# Patient Record
Sex: Female | Born: 1935 | Race: White | Hispanic: No | Marital: Married | Smoking: Current every day smoker
Health system: Southern US, Community
[De-identification: ages and names within clinical notes are randomized; demographics above are authoritative.]

## PROBLEM LIST (undated history)

## (undated) DIAGNOSIS — K219 Gastro-esophageal reflux disease without esophagitis: Secondary | ICD-10-CM

## (undated) DIAGNOSIS — C3490 Malignant neoplasm of unspecified part of unspecified bronchus or lung: Secondary | ICD-10-CM

## (undated) DIAGNOSIS — M810 Age-related osteoporosis without current pathological fracture: Secondary | ICD-10-CM

## (undated) HISTORY — PX: ESOPHAGEAL DILATION: SHX303

## (undated) HISTORY — PX: BACK SURGERY: SHX140

## (undated) HISTORY — PX: BREAST EXCISIONAL BIOPSY: SUR124

## (undated) HISTORY — PX: CERVICAL FUSION: SHX112

---

## 2004-12-08 ENCOUNTER — Emergency Department: Payer: Self-pay | Admitting: Emergency Medicine

## 2009-01-24 ENCOUNTER — Emergency Department: Payer: Self-pay | Admitting: Emergency Medicine

## 2014-06-30 ENCOUNTER — Emergency Department: Payer: Medicare Other

## 2014-06-30 ENCOUNTER — Encounter: Payer: Self-pay | Admitting: Emergency Medicine

## 2014-06-30 ENCOUNTER — Emergency Department
Admission: EM | Admit: 2014-06-30 | Discharge: 2014-07-01 | Disposition: A | Discharge status: Home / Self Care | Payer: Medicare Other | Attending: Emergency Medicine | Admitting: Emergency Medicine

## 2014-06-30 DIAGNOSIS — M546 Pain in thoracic spine: Secondary | ICD-10-CM | POA: Diagnosis not present

## 2014-06-30 DIAGNOSIS — J159 Unspecified bacterial pneumonia: Secondary | ICD-10-CM | POA: Insufficient documentation

## 2014-06-30 DIAGNOSIS — Z9889 Other specified postprocedural states: Secondary | ICD-10-CM | POA: Insufficient documentation

## 2014-06-30 DIAGNOSIS — R091 Pleurisy: Secondary | ICD-10-CM

## 2014-06-30 DIAGNOSIS — R05 Cough: Secondary | ICD-10-CM | POA: Diagnosis present

## 2014-06-30 DIAGNOSIS — J189 Pneumonia, unspecified organism: Secondary | ICD-10-CM

## 2014-06-30 DIAGNOSIS — Z720 Tobacco use: Secondary | ICD-10-CM | POA: Insufficient documentation

## 2014-06-30 NOTE — ED Provider Notes (Signed)
[facility] Emergency Department Provider Note  ____________________________________________  Time seen: 11:00 PM  I have reviewed the triage vital signs and the nursing notes.   HISTORY  Chief Complaint Back Pain and Cough      HPI Krystal Greene is a 79 y.o. female presents with mid upper back pain that has been progressively worsening since Monday pain is located in between the patient's scapula current pain is "mild" per patient however patient took 2 Excedrin prior to arrival to the emergency department. Patient admits to recent upper respiratory tract infection involving cough. Patient denies any chest pain at this time.     History reviewed. No pertinent past medical history.  There are no active problems to display for this patient.   Past Surgical History  Procedure Laterality Date  . Back surgery    . Cervical fusion    . Esophageal dilation      No current outpatient prescriptions on file.  Allergies Hydrocodone  No family history on file.  Social History History  Substance Use Topics  . Smoking status: Current Every Day Smoker -- 1.00 packs/day    Types: Cigarettes  . Smokeless tobacco: Never Used  . Alcohol Use: 0.6 oz/week    1 Glasses of wine per week    Review of Systems  Constitutional: Negative for fever. Eyes: Negative for visual changes. ENT: Negative for sore throat. Cardiovascular: Negative for chest pain. Respiratory: Negative for shortness of breath. Gastrointestinal: Negative for abdominal pain, vomiting and diarrhea. Genitourinary: Negative for dysuria. Musculoskeletal: Negative for back pain. Skin: Negative for rash. Neurological: Negative for headaches, focal weakness or numbness.   10-point ROS otherwise negative.  ____________________________________________   PHYSICAL EXAM:  VITAL SIGNS: ED Triage Vitals  Enc Vitals Group     BP 06/30/14 2214 171/87 mmHg     Pulse Rate 06/30/14 2214  111     Resp 06/30/14 2214 20     Temp 06/30/14 2214 98.4 F (36.9 C)     Temp Source 06/30/14 2214 Oral     SpO2 06/30/14 2214 94 %     Weight 06/30/14 2214 130 lb (58.968 kg)     Height 06/30/14 2214 '5\' 1"'$  (1.549 m)     Head Cir --      Peak Flow --      Pain Score 06/30/14 2216 5     Pain Loc --      Pain Edu? --      Excl. in [place]? --      Constitutional: Alert and oriented. Well appearing and in no distress. Eyes: Conjunctivae are normal. PERRL. Normal extraocular movements. ENT   Head: Normocephalic and atraumatic.   Nose: No congestion/rhinnorhea.   Mouth/Throat: Mucous membranes are moist.   Neck: No stridor. Hematological/Lymphatic/Immunilogical: No cervical lymphadenopathy. Cardiovascular: Normal rate, regular rhythm. Normal and symmetric distal pulses are present in all extremities. No murmurs, rubs, or gallops. Respiratory: Normal respiratory effort without tachypnea nor retractions. Breath sounds are clear and equal bilaterally. No wheezes/rales/rhonchi. Gastrointestinal: Soft and nontender. No distention. There is no CVA tenderness. Genitourinary: deferred Musculoskeletal: Nontender with normal range of motion in all extremities. No joint effusions.  No lower extremity tenderness nor edema. Neurologic:  Normal speech and language. No gross focal neurologic deficits are appreciated. Speech is normal.  Skin:  Skin is warm, dry and intact. No rash noted. Psychiatric: Mood and affect are normal. Speech and behavior are normal. Patient exhibits appropriate insight and judgment.  ____________________________________________  LABS (pertinent positives/negatives)  Labs Reviewed  CBC WITH DIFFERENTIAL/PLATELET - Abnormal; Notable for the following:    WBC 13.0 (*)    Neutro Abs 9.8 (*)    All other components within normal limits  COMPREHENSIVE METABOLIC PANEL - Abnormal; Notable for the following:    Chloride 99 (*)    Glucose, Bld 161 (*)    All other  components within normal limits  TROPONIN I     ____________________________________________    RADIOLOGY  1.5cm left upper lobe ground glass opacity  ____________________________________________     INITIAL IMPRESSION / ASSESSMENT AND PLAN / ED COURSE  Pertinent labs & imaging results that were available during my care of the patient were reviewed by me and considered in my medical decision making (see chart for details).  History, physical and CT findings consistent with possible pleurisy with possible pneumonia. Patient and family advised of CT findings and need fo followup  ____________________________________________   FINAL CLINICAL IMPRESSION(S) / ED DIAGNOSES  Final diagnoses:  Pleurisy  Community acquired pneumonia      Gregor Hams, MD 07/02/14 2234

## 2014-06-30 NOTE — ED Notes (Signed)
Pt presents to ED with back pain since Monday. Pt states her pain started on both sides of her mid back and has now radiated inbetween her scapula. Pain has increased in severity. No known injury and no hx of the same. Pt alert with no increased work of breathing or acute distress noted. Pt denies chest pain, nausea, vomiting, or diarrhea.

## 2014-07-01 ENCOUNTER — Emergency Department: Payer: Medicare Other

## 2014-07-01 DIAGNOSIS — J159 Unspecified bacterial pneumonia: Secondary | ICD-10-CM | POA: Diagnosis not present

## 2014-07-01 LAB — COMPREHENSIVE METABOLIC PANEL
ALBUMIN: 4 g/dL (ref 3.5–5.0)
ALT: 16 U/L (ref 14–54)
AST: 21 U/L (ref 15–41)
Alkaline Phosphatase: 78 U/L (ref 38–126)
Anion gap: 10 (ref 5–15)
BILIRUBIN TOTAL: 0.5 mg/dL (ref 0.3–1.2)
BUN: 15 mg/dL (ref 6–20)
CALCIUM: 9.6 mg/dL (ref 8.9–10.3)
CO2: 27 mmol/L (ref 22–32)
CREATININE: 0.76 mg/dL (ref 0.44–1.00)
Chloride: 99 mmol/L — ABNORMAL LOW (ref 101–111)
GFR calc Af Amer: >60 mL/min (ref >60)
Glucose, Bld: 161 mg/dL — ABNORMAL HIGH (ref 65–99)
Potassium: 3.7 mmol/L (ref 3.5–5.1)
SODIUM: 136 mmol/L (ref 135–145)
Total Protein: 7.7 g/dL (ref 6.5–8.1)

## 2014-07-01 LAB — CBC WITH DIFFERENTIAL/PLATELET
BASOS PCT: 1 %
Basophils Absolute: 0.1 10*3/uL (ref 0–0.1)
Eosinophils Absolute: 0.2 10*3/uL (ref 0–0.7)
Eosinophils Relative: 2 %
HCT: 43.8 % (ref 35.0–47.0)
HEMOGLOBIN: 14.9 g/dL (ref 12.0–16.0)
Lymphocytes Relative: 16 %
Lymphs Abs: 2 10*3/uL (ref 1.0–3.6)
MCH: 30.7 pg (ref 26.0–34.0)
MCHC: 34 g/dL (ref 32.0–36.0)
MCV: 90.3 fL (ref 80.0–100.0)
MONOS PCT: 6 %
Monocytes Absolute: 0.7 10*3/uL (ref 0.2–0.9)
NEUTROS ABS: 9.8 10*3/uL — AB (ref 1.4–6.5)
Neutrophils Relative %: 75 %
Platelets: 415 10*3/uL (ref 150–440)
RBC: 4.85 MIL/uL (ref 3.80–5.20)
RDW: 13.3 % (ref 11.5–14.5)
WBC: 13 10*3/uL — AB (ref 3.6–11.0)

## 2014-07-01 LAB — TROPONIN I: Troponin I: <0.03 ng/mL (ref <0.031)

## 2014-07-01 MED ORDER — AZITHROMYCIN 500 MG PO TABS: 500.0000 mg | ORAL_TABLET | Freq: Every day | ORAL | Status: DC

## 2014-07-01 MED ORDER — IOHEXOL 350 MG/ML SOLN
75.0000 mL | Freq: Once | INTRAVENOUS | Status: AC | PRN
  Administered 2014-07-01: 75 mL via INTRAVENOUS

## 2014-07-01 MED ORDER — KETOROLAC TROMETHAMINE 10 MG PO TABS: 10.0000 mg | ORAL_TABLET | Freq: Four times a day (QID) | ORAL | Status: DC | PRN

## 2014-07-01 MED ORDER — KETOROLAC TROMETHAMINE 10 MG PO TABS
ORAL_TABLET | ORAL | Status: AC
  Administered 2014-07-01: 10 mg via ORAL
  Filled 2014-07-01: qty 1

## 2014-07-01 MED ORDER — OXYCODONE-ACETAMINOPHEN 5-325 MG PO TABS
1.0000 | ORAL_TABLET | Freq: Once | ORAL | Status: AC
  Administered 2014-07-01: 1 via ORAL

## 2014-07-01 MED ORDER — KETOROLAC TROMETHAMINE 10 MG PO TABS
10.0000 mg | ORAL_TABLET | Freq: Once | ORAL | Status: AC
  Administered 2014-07-01: 10 mg via ORAL

## 2014-07-01 MED ORDER — AZITHROMYCIN 250 MG PO TABS
ORAL_TABLET | ORAL | Status: AC
  Administered 2014-07-01: 500 mg via ORAL
  Filled 2014-07-01: qty 2

## 2014-07-01 MED ORDER — AZITHROMYCIN 250 MG PO TABS
500.0000 mg | ORAL_TABLET | Freq: Once | ORAL | Status: AC
  Administered 2014-07-01: 500 mg via ORAL

## 2014-07-01 NOTE — Discharge Instructions (Signed)
Pleurisy Pleurisy is an inflammation and swelling of the lining of the lungs (pleura). Because of this inflammation, it hurts to breathe. It can be aggravated by coughing, laughing, or deep breathing. Pleurisy is often caused by an underlying infection or disease.  HOME CARE INSTRUCTIONS  Monitor your pleurisy for any changes. The following actions may help to alleviate any discomfort you are experiencing:  Medicine may help with pain. Only take over-the-counter or prescription medicines for pain, discomfort, or fever as directed by your health care provider.  Only take antibiotic medicine as directed. Make sure to finish it even if you start to feel better. SEEK MEDICAL CARE IF:   Your pain is not controlled with medicine or is increasing.  You have an increase in pus-like (purulent) secretions brought up with coughing. SEEK IMMEDIATE MEDICAL CARE IF:   You have blue or dark lips, fingernails, or toenails.  You are coughing up blood.  You have increased difficulty breathing.  You have continuing pain unrelieved by medicine or pain lasting more than 1 week.  You have pain that radiates into your neck, arms, or jaw.  You develop increased shortness of breath or wheezing.  You develop a fever, rash, vomiting, fainting, or other serious symptoms. MAKE SURE YOU:  Understand these instructions.   Will watch your condition.   Will get help right away if you are not doing well or get worse.  Document Released: 01/10/2005 Document Revised: 09/12/2012 Document Reviewed: 06/24/2012 [facility] Patient Information 2015 [place], [place]. This information is not intended to replace advice given to you by your health care provider. Make sure you discuss any questions you have with your health care provider.  Pneumonia Pneumonia is an infection of the lungs.  CAUSES Pneumonia may be caused by bacteria or a virus. Usually, these infections are caused by breathing infectious particles into  the lungs (respiratory tract). SIGNS AND SYMPTOMS   Cough.  Fever.  Chest pain.  Increased rate of breathing.  Wheezing.  Mucus production. DIAGNOSIS  If you have the common symptoms of pneumonia, your health care provider will typically confirm the diagnosis with a chest X-ray. The X-ray will show an abnormality in the lung (pulmonary infiltrate) if you have pneumonia. Other tests of your blood, urine, or sputum may be done to find the specific cause of your pneumonia. Your health care provider may also do tests (blood gases or pulse oximetry) to see how well your lungs are working. TREATMENT  Some forms of pneumonia may be spread to other people when you cough or sneeze. You may be asked to wear a mask before and during your exam. Pneumonia that is caused by bacteria is treated with antibiotic medicine. Pneumonia that is caused by the influenza virus may be treated with an antiviral medicine. Most other viral infections must run their course. These infections will not respond to antibiotics.  HOME CARE INSTRUCTIONS   Cough suppressants may be used if you are losing too much rest. However, coughing protects you by clearing your lungs. You should avoid using cough suppressants if you can.  Your health care provider may have prescribed medicine if he or she thinks your pneumonia is caused by bacteria or influenza. Finish your medicine even if you start to feel better.  Your health care provider may also prescribe an expectorant. This loosens the mucus to be coughed up.  Take medicines only as directed by your health care provider.  Do not smoke. Smoking is a common cause of bronchitis and  can contribute to pneumonia. If you are a smoker and continue to smoke, your cough may last several weeks after your pneumonia has cleared.  A cold steam vaporizer or humidifier in your room or home may help loosen mucus.  Coughing is often worse at night. Sleeping in a semi-upright position in a  recliner or using a couple pillows under your head will help with this.  Get rest as you feel it is needed. Your body will usually let you know when you need to rest. PREVENTION A pneumococcal shot (vaccine) is available to prevent a common bacterial cause of pneumonia. This is usually suggested for:  People over 34 years old.  Patients on chemotherapy.  People with chronic lung problems, such as bronchitis or emphysema.  People with immune system problems. If you are over 65 or have a high risk condition, you may receive the pneumococcal vaccine if you have not received it before. In some countries, a routine influenza vaccine is also recommended. This vaccine can help prevent some cases of pneumonia.You may be offered the influenza vaccine as part of your care. If you smoke, it is time to quit. You may receive instructions on how to stop smoking. Your health care provider can provide medicines and counseling to help you quit. SEEK MEDICAL CARE IF: You have a fever. SEEK IMMEDIATE MEDICAL CARE IF:   Your illness becomes worse. This is especially true if you are elderly or weakened from any other disease.  You cannot control your cough with suppressants and are losing sleep.  You begin coughing up blood.  You develop pain which is getting worse or is uncontrolled with medicines.  Any of the symptoms which initially brought you in for treatment are getting worse rather than better.  You develop shortness of breath or chest pain. MAKE SURE YOU:   Understand these instructions.  Will watch your condition.  Will get help right away if you are not doing well or get worse. Document Released: 01/10/2005 Document Revised: 05/27/2013 Document Reviewed: 04/01/2010 [facility] Patient Information 2015 [place], [place]. This information is not intended to replace advice given to you by your health care provider. Make sure you discuss any questions you have with your health care provider.

## 2014-07-12 ENCOUNTER — Encounter: Payer: Self-pay | Admitting: Emergency Medicine

## 2014-07-12 ENCOUNTER — Other Ambulatory Visit: Payer: Self-pay

## 2014-07-12 ENCOUNTER — Emergency Department: Payer: Medicare Other

## 2014-07-12 DIAGNOSIS — M549 Dorsalgia, unspecified: Secondary | ICD-10-CM | POA: Diagnosis present

## 2014-07-12 DIAGNOSIS — M546 Pain in thoracic spine: Secondary | ICD-10-CM | POA: Diagnosis not present

## 2014-07-12 DIAGNOSIS — R091 Pleurisy: Secondary | ICD-10-CM | POA: Insufficient documentation

## 2014-07-12 DIAGNOSIS — Z720 Tobacco use: Secondary | ICD-10-CM | POA: Diagnosis not present

## 2014-07-12 MED ORDER — ONDANSETRON 4 MG PO TBDP
ORAL_TABLET | ORAL | Status: AC
  Administered 2014-07-12: 4 mg via ORAL
  Filled 2014-07-12: qty 1

## 2014-07-12 MED ORDER — OXYCODONE HCL 5 MG PO TABS
ORAL_TABLET | ORAL | Status: AC
  Administered 2014-07-12: 5 mg
  Filled 2014-07-12: qty 1

## 2014-07-12 MED ORDER — OXYCODONE-ACETAMINOPHEN 5-325 MG PO TABS: 1.0000 | ORAL_TABLET | Freq: Once | ORAL | Status: AC

## 2014-07-12 MED ORDER — ONDANSETRON 4 MG PO TBDP
4.0000 mg | ORAL_TABLET | Freq: Once | ORAL | Status: AC
  Administered 2014-07-12: 4 mg via ORAL

## 2014-07-12 NOTE — ED Notes (Signed)
Patient was seen here June 7 and diagnosed with pneumonia and pleurisy. Patient was given antibiotics and steroids. Patient finished the coarse of antibiotics and steroids Tuesday. Patient developed increase pain to her back today that is not being relieved by her tramadol.

## 2014-07-13 ENCOUNTER — Emergency Department
Admission: EM | Admit: 2014-07-13 | Discharge: 2014-07-13 | Disposition: A | Discharge status: Home / Self Care | Payer: Medicare Other | Attending: Emergency Medicine | Admitting: Emergency Medicine

## 2014-07-13 DIAGNOSIS — M546 Pain in thoracic spine: Secondary | ICD-10-CM

## 2014-07-13 DIAGNOSIS — R091 Pleurisy: Secondary | ICD-10-CM

## 2014-07-13 MED ORDER — OXYCODONE-ACETAMINOPHEN 5-325 MG PO TABS
ORAL_TABLET | ORAL | Status: AC
  Administered 2014-07-13: 1 via ORAL
  Filled 2014-07-13: qty 1

## 2014-07-13 MED ORDER — OXYCODONE-ACETAMINOPHEN 5-325 MG PO TABS: 1.0000 | ORAL_TABLET | Freq: Four times a day (QID) | ORAL | Status: AC | PRN

## 2014-07-13 MED ORDER — OXYCODONE-ACETAMINOPHEN 5-325 MG PO TABS
1.0000 | ORAL_TABLET | Freq: Once | ORAL | Status: AC
  Administered 2014-07-13: 1 via ORAL

## 2014-07-13 NOTE — Discharge Instructions (Signed)
Back Pain, Adult Low back pain is very common. About 1 in 5 people have back pain.The cause of low back pain is rarely dangerous. The pain often gets better over time.About half of people with a sudden onset of back pain feel better in just 2 weeks. About 8 in 10 people feel better by 6 weeks.  CAUSES Some common causes of back pain include:  Strain of the muscles or ligaments supporting the spine.  Wear and tear (degeneration) of the spinal discs.  Arthritis.  Direct injury to the back. DIAGNOSIS Most of the time, the direct cause of low back pain is not known.However, back pain can be treated effectively even when the exact cause of the pain is unknown.Answering your caregiver's questions about your overall health and symptoms is one of the most accurate ways to make sure the cause of your pain is not dangerous. If your caregiver needs more information, he or she may order lab work or imaging tests (X-rays or MRIs).However, even if imaging tests show changes in your back, this usually does not require surgery. HOME CARE INSTRUCTIONS For many people, back pain returns.Since low back pain is rarely dangerous, it is often a condition that people can learn to [facility] their own.   Remain active. It is stressful on the back to sit or stand in one place. Do not sit, drive, or stand in one place for more than 30 minutes at a time. Take short walks on level surfaces as soon as pain allows.Try to increase the length of time you walk each day.  Do not stay in bed.Resting more than 1 or 2 days can delay your recovery.  Do not avoid exercise or work.Your body is made to move.It is not dangerous to be active, even though your back may hurt.Your back will likely heal faster if you return to being active before your pain is gone.  Pay attention to your body when you bend and lift. Many people have less discomfortwhen lifting if they bend their knees, keep the load close to their bodies,and  avoid twisting. Often, the most comfortable positions are those that put less stress on your recovering back.  Find a comfortable position to sleep. Use a firm mattress and lie on your side with your knees slightly bent. If you lie on your back, put a pillow under your knees.  Only take over-the-counter or prescription medicines as directed by your caregiver. Over-the-counter medicines to reduce pain and inflammation are often the most helpful.Your caregiver may prescribe muscle relaxant drugs.These medicines help dull your pain so you can more quickly return to your normal activities and healthy exercise.  Put ice on the injured area.  Put ice in a plastic bag.  Place a towel between your skin and the bag.  Leave the ice on for 15-20 minutes, 03-04 times a day for the first 2 to 3 days. After that, ice and heat may be alternated to reduce pain and spasms.  Ask your caregiver about trying back exercises and gentle massage. This may be of some benefit.  Avoid feeling anxious or stressed.Stress increases muscle tension and can worsen back pain.It is important to recognize when you are anxious or stressed and learn ways to manage it.Exercise is a great option. SEEK MEDICAL CARE IF:  You have pain that is not relieved with rest or medicine.  You have pain that does not improve in 1 week.  You have new symptoms.  You are generally not feeling well. SEEK  IMMEDIATE MEDICAL CARE IF:   You have pain that radiates from your back into your legs.  You develop new bowel or bladder control problems.  You have unusual weakness or numbness in your arms or legs.  You develop nausea or vomiting.  You develop abdominal pain.  You feel faint. Document Released: 01/10/2005 Document Revised: 07/12/2011 Document Reviewed: 05/14/2013 [facility] Patient Information 2015 [place], [place]. This information is not intended to replace advice given to you by your health care provider. Make sure you  discuss any questions you have with your health care provider.  Pleurisy Pleurisy is an inflammation and swelling of the lining of the lungs (pleura). Because of this inflammation, it hurts to breathe. It can be aggravated by coughing, laughing, or deep breathing. Pleurisy is often caused by an underlying infection or disease.  HOME CARE INSTRUCTIONS  Monitor your pleurisy for any changes. The following actions may help to alleviate any discomfort you are experiencing:  Medicine may help with pain. Only take over-the-counter or prescription medicines for pain, discomfort, or fever as directed by your health care provider.  Only take antibiotic medicine as directed. Make sure to finish it even if you start to feel better. SEEK MEDICAL CARE IF:   Your pain is not controlled with medicine or is increasing.  You have an increase in pus-like (purulent) secretions brought up with coughing. SEEK IMMEDIATE MEDICAL CARE IF:   You have blue or dark lips, fingernails, or toenails.  You are coughing up blood.  You have increased difficulty breathing.  You have continuing pain unrelieved by medicine or pain lasting more than 1 week.  You have pain that radiates into your neck, arms, or jaw.  You develop increased shortness of breath or wheezing.  You develop a fever, rash, vomiting, fainting, or other serious symptoms. MAKE SURE YOU:  Understand these instructions.   Will watch your condition.   Will get help right away if you are not doing well or get worse.  Document Released: 01/10/2005 Document Revised: 09/12/2012 Document Reviewed: 06/24/2012 [facility] Patient Information 2015 [place], [place]. This information is not intended to replace advice given to you by your health care provider. Make sure you discuss any questions you have with your health care provider.

## 2014-07-13 NOTE — ED Notes (Signed)
Pt. Seen here two weeks ago for pneumonia and pleurisy.  Pt. States finishing antibiotics and steroids that were affective.  Pt. States back pain was unrelieved by pain medication.

## 2014-07-13 NOTE — ED Provider Notes (Signed)
[facility] Emergency Department Provider Note  ____________________________________________  Time seen: Approximately 0047 AM  I have reviewed the triage vital signs and the nursing notes.   HISTORY  Chief Complaint Back Pain    HPI Krystal Greene is a 79 y.o. female who was here 1 week ago and seen by Dr. Owens Shark and diagnosed with pleurisy and pneumonia. The patient reports that she was sent home with azithromycin and Toradol. She reports that she was seen by Dr. Doy Hutching 2 days later and placed on tramadol and prednisone. The patient reports that she has been still having pain but improving but today the pain seemed to worsen tonight. The patient reports that the pain was sharp and stabbing and that the tramadol she received was not helping at all with the pain. It is in the left lower portion of her back. She did not receive any pain medication but after receiving some medicine in the triage area she reports that the pain is improved. She has had no shortness of breath but has had a mild cough that is becoming more loose. She's had no fevers or other chest pains.Her pain is currently a 0 out of 10 in intensity.   No past medical history on file.  There are no active problems to display for this patient.   Past Surgical History  Procedure Laterality Date  . Back surgery    . Cervical fusion    . Esophageal dilation      Current Outpatient Rx  Name  Route  Sig  Dispense  Refill  . ibuprofen (ADVIL,MOTRIN) 200 MG tablet   Oral   Take 200 mg by mouth every 6 (six) hours as needed for fever, headache or mild pain.         Marland Kitchen azithromycin (ZITHROMAX) 500 MG tablet   Oral   Take 1 tablet (500 mg total) by mouth daily. Patient not taking: Reported on 07/13/2014   3 tablet   0   . ketorolac (TORADOL) 10 MG tablet   Oral   Take 1 tablet (10 mg total) by mouth every 6 (six) hours as needed. Patient not taking: Reported on 07/13/2014   20 tablet   0    . oxyCODONE-acetaminophen (ROXICET) 5-325 MG per tablet   Oral   Take 1 tablet by mouth every 6 (six) hours as needed.   12 tablet   0     Allergies Hydrocodone  No family history on file.  Social History History  Substance Use Topics  . Smoking status: Current Every Day Smoker -- 1.00 packs/day    Types: Cigarettes  . Smokeless tobacco: Never Used  . Alcohol Use: 0.6 oz/week    1 Glasses of wine per week    Review of Systems Constitutional: No fever/chills Eyes: No visual changes. ENT: No sore throat. Cardiovascular: Denies chest pain. Respiratory: Cough without  shortness of breath. Gastrointestinal: No abdominal pain.  No nausea, no vomiting.  Genitourinary: Negative for dysuria. Musculoskeletal: back pain. Skin: Negative for rash. Neurological: Negative for headaches  10-point ROS otherwise negative.  ____________________________________________   PHYSICAL EXAM:  VITAL SIGNS: ED Triage Vitals  Enc Vitals Group     BP 07/12/14 1946 157/73 mmHg     Pulse Rate 07/12/14 1946 84     Resp 07/12/14 1946 18     Temp 07/12/14 1946 98.2 F (36.8 C)     Temp Source 07/12/14 1946 Oral     SpO2 07/12/14 1946 93 %  Weight 07/12/14 1946 130 lb (58.968 kg)     Height 07/12/14 1946 '5\' 2"'$  (1.575 m)     Head Cir --      Peak Flow --      Pain Score 07/12/14 1947 10     Pain Loc --      Pain Edu? --      Excl. in [place]? --     Constitutional: Alert and oriented. Well appearing and in no acute distress. Eyes: Conjunctivae are normal. PERRL. EOMI. Head: Atraumatic. Nose: No congestion/rhinnorhea. Mouth/Throat: Mucous membranes are moist.  Oropharynx non-erythematous. Cardiovascular: Normal rate, regular rhythm. Grossly normal heart sounds.  Good peripheral circulation. Respiratory: Normal respiratory effort.  No retractions. Lungs CTAB. Gastrointestinal: Soft and nontender. No distention. Positive bowel sounds Genitourinary: Deferred Musculoskeletal: No lower  extremity tenderness nor edema.  No joint effusions. Neurologic:  Normal speech and language. No gross focal neurologic deficits are appreciated.  Skin:  Skin is warm, dry and intact. No rash noted. Psychiatric: Mood and affect are normal.   ____________________________________________   LABS (all labs ordered are listed, but only abnormal results are displayed)  Labs Reviewed - No data to display ____________________________________________  EKG  ED ECG REPORT I, Loney Hering, the attending physician, personally viewed and interpreted this ECG.   Date: 07/13/2014  EKG Time: 2001  Rate: 75  Rhythm: normal sinus rhythm  Axis: Left  Intervals:none  ST&T Change: None  ____________________________________________  RADIOLOGY  Chest x-ray: No edema or consolidation seen radiographically, given the appearance of the opacity in the left upper lobe on recent CT the recommendation for follow-up CT in 3 months remains and effect no new opacity. ____________________________________________   PROCEDURES  Procedure(s) performed: None  Critical Care performed: No  ____________________________________________   INITIAL IMPRESSION / ASSESSMENT AND PLAN / ED COURSE  Pertinent labs & imaging results that were available during my care of the patient were reviewed by me and considered in my medical decision making (see chart for details).  The patient is a 79 year old female who comes in with some left-sided back pain. I will review the patient's previous visit and determine if the patient needs any further assessment.  After reviewing the patient's previous note the patient did receive a CT angiography looking for a PE which was negative. I will give the patient prescription for some Percocet as that does relieve her pain and have her follow back up with Dr. Doy Hutching. ____________________________________________   FINAL CLINICAL IMPRESSION(S) / ED DIAGNOSES  Final diagnoses:   Left-sided thoracic back pain  Pleurisy      Loney Hering, MD 07/13/14 [phone]

## 2014-10-23 ENCOUNTER — Other Ambulatory Visit: Payer: Self-pay | Admitting: Internal Medicine

## 2014-10-23 DIAGNOSIS — R918 Other nonspecific abnormal finding of lung field: Secondary | ICD-10-CM

## 2014-10-23 DIAGNOSIS — Z1231 Encounter for screening mammogram for malignant neoplasm of breast: Secondary | ICD-10-CM

## 2014-10-27 ENCOUNTER — Other Ambulatory Visit: Payer: Self-pay | Admitting: Neurology

## 2014-10-27 DIAGNOSIS — G3184 Mild cognitive impairment, so stated: Secondary | ICD-10-CM

## 2014-10-29 ENCOUNTER — Ambulatory Visit
Admission: RE | Admit: 2014-10-29 | Discharge: 2014-10-29 | Disposition: A | Discharge status: Home / Self Care | Payer: Medicare Other | Source: Ambulatory Visit | Attending: Internal Medicine | Admitting: Internal Medicine

## 2014-10-29 DIAGNOSIS — R918 Other nonspecific abnormal finding of lung field: Secondary | ICD-10-CM | POA: Insufficient documentation

## 2014-10-29 DIAGNOSIS — I2510 Atherosclerotic heart disease of native coronary artery without angina pectoris: Secondary | ICD-10-CM | POA: Insufficient documentation

## 2014-10-29 DIAGNOSIS — R922 Inconclusive mammogram: Secondary | ICD-10-CM | POA: Insufficient documentation

## 2014-10-29 DIAGNOSIS — Z1231 Encounter for screening mammogram for malignant neoplasm of breast: Secondary | ICD-10-CM | POA: Diagnosis present

## 2014-10-29 LAB — POCT I-STAT CREATININE: CREATININE: 0.9 mg/dL (ref 0.44–1.00)

## 2014-10-29 MED ORDER — IOHEXOL 300 MG/ML  SOLN
50.0000 mL | Freq: Once | INTRAMUSCULAR | Status: AC | PRN
  Administered 2014-10-29: 50 mL via INTRAVENOUS

## 2014-10-30 ENCOUNTER — Other Ambulatory Visit: Payer: Self-pay | Admitting: Internal Medicine

## 2014-10-30 ENCOUNTER — Ambulatory Visit
Admission: RE | Admit: 2014-10-30 | Discharge: 2014-10-30 | Disposition: A | Discharge status: Home / Self Care | Payer: Medicare Other | Source: Ambulatory Visit | Attending: Internal Medicine | Admitting: Internal Medicine

## 2014-10-30 DIAGNOSIS — Z1231 Encounter for screening mammogram for malignant neoplasm of breast: Secondary | ICD-10-CM

## 2014-10-30 DIAGNOSIS — R9389 Abnormal findings on diagnostic imaging of other specified body structures: Secondary | ICD-10-CM

## 2014-10-30 DIAGNOSIS — R918 Other nonspecific abnormal finding of lung field: Secondary | ICD-10-CM

## 2014-10-30 DIAGNOSIS — J441 Chronic obstructive pulmonary disease with (acute) exacerbation: Secondary | ICD-10-CM

## 2014-11-03 ENCOUNTER — Ambulatory Visit: Admission: RE | Admit: 2014-11-03 | Payer: Medicare Other | Source: Ambulatory Visit

## 2014-11-04 ENCOUNTER — Ambulatory Visit: Payer: Medicare Other | Attending: Internal Medicine

## 2014-11-04 DIAGNOSIS — R918 Other nonspecific abnormal finding of lung field: Secondary | ICD-10-CM | POA: Insufficient documentation

## 2014-11-04 MED ORDER — ALBUTEROL SULFATE (2.5 MG/3ML) 0.083% IN NEBU
2.5000 mg | INHALATION_SOLUTION | Freq: Once | RESPIRATORY_TRACT | Status: AC
  Administered 2014-11-04: 2.5 mg via RESPIRATORY_TRACT
  Filled 2014-11-04: qty 3

## 2014-11-06 ENCOUNTER — Ambulatory Visit: Payer: Medicare Other

## 2014-11-07 ENCOUNTER — Inpatient Hospital Stay: Payer: Medicare Other | Admitting: Cardiothoracic Surgery

## 2014-11-07 ENCOUNTER — Ambulatory Visit
Admission: RE | Admit: 2014-11-07 | Discharge: 2014-11-07 | Disposition: A | Discharge status: Home / Self Care | Payer: Medicare Other | Source: Ambulatory Visit | Attending: Internal Medicine | Admitting: Internal Medicine

## 2014-11-07 ENCOUNTER — Inpatient Hospital Stay: Payer: Medicare Other | Attending: Cardiothoracic Surgery | Admitting: Cardiothoracic Surgery

## 2014-11-07 VITALS — BP 149/82 | HR 101 | Temp 97.3°F | Resp 18 | Ht 62.0 in | Wt 116.2 lb

## 2014-11-07 DIAGNOSIS — R9389 Abnormal findings on diagnostic imaging of other specified body structures: Secondary | ICD-10-CM

## 2014-11-07 DIAGNOSIS — R918 Other nonspecific abnormal finding of lung field: Secondary | ICD-10-CM | POA: Insufficient documentation

## 2014-11-07 DIAGNOSIS — I2510 Atherosclerotic heart disease of native coronary artery without angina pectoris: Secondary | ICD-10-CM | POA: Diagnosis not present

## 2014-11-07 LAB — GLUCOSE, CAPILLARY: Glucose-Capillary: 114 mg/dL — ABNORMAL HIGH (ref 65–99)

## 2014-11-07 MED ORDER — FLUDEOXYGLUCOSE F - 18 (FDG) INJECTION: 12.3500 | Freq: Once | INTRAVENOUS | Status: DC | PRN

## 2014-11-07 NOTE — Progress Notes (Signed)
Patient ID: Krystal Greene, female   DOB: [birth year], 79 y.o.   MRN: [id number]  Chief Complaint  Patient presents with  . Abnormal CT Scan    Referral-Dr. Doy Hutching    Referred By Dr. Doy Hutching Reason for Referral left upper lobe mass  HPI Location, Quality, Duration, Severity, Timing, Context, Modifying Factors, Associated Signs and Symptoms.  Krystal Greene is a 79 y.o. female.  This patient is a 79 year old white female who presented to the emergency room with chief complaints of back pain. She has had multiple back operations in the past and has a history of osteoporosis. She hadn't been to see a doctor in many years and because she has no primary care physician she presented to the emergency room for evaluation of her back pain. During that evaluation she had a chest x-ray and a CT scan done. The CT scan showed a left upper lobe groundglass opacity which was consistent with an adenocarcinoma. The patient was then seen by Dr. Fulton Reek in the current nodal clinic for establishment of care. Dr. Doy Hutching then obtained a repeat CT scan and this revealed a slight increase in the size of the groundglass opacity in the left upper lobe. PET scan done today shows some low-level activity again consistent with a low-grade adenocarcinoma. The patient comes in today to discuss her treatment options. She does continue to have back pain which she says is worse with exertion. She takes an occasional narcotic tablet for those. Most the time when she is at home she does not complain of any significant pain. She does smoke and smokes at least a pack cigarettes a day and has been doing so most of her life  She also has a family history of lung cancer in her sister. She has not had any weight loss.  No past medical history on file.  Past Surgical History  Procedure Laterality Date  . Back surgery    . Cervical fusion    . Esophageal dilation    . Breast excisional biopsy Right 30+ yrs ago?    neg    No  family history on file.  Social History Social History  Substance Use Topics  . Smoking status: Current Every Day Smoker -- 1.00 packs/day    Types: Cigarettes  . Smokeless tobacco: Never Used  . Alcohol Use: 0.6 oz/week    1 Glasses of wine per week    Allergies  Allergen Reactions  . Hydrocodone Other (See Comments)    Syncope     Current Outpatient Prescriptions  Medication Sig Dispense Refill  . alendronate (FOSAMAX) 70 MG tablet Take by mouth.    Marland Kitchen ibuprofen (ADVIL,MOTRIN) 200 MG tablet Take 200 mg by mouth every 6 (six) hours as needed for fever, headache or mild pain.    Marland Kitchen omeprazole (PRILOSEC) 20 MG capsule Take by mouth.    Marland Kitchen azithromycin (ZITHROMAX) 500 MG tablet Take 1 tablet (500 mg total) by mouth daily. (Patient not taking: Reported on 07/13/2014) 3 tablet 0  . ketorolac (TORADOL) 10 MG tablet Take 1 tablet (10 mg total) by mouth every 6 (six) hours as needed. (Patient not taking: Reported on 11/07/2014) 20 tablet 0  . oxyCODONE-acetaminophen (ROXICET) 5-325 MG per tablet Take 1 tablet by mouth every 6 (six) hours as needed. (Patient not taking: Reported on 11/07/2014) 12 tablet 0   No current facility-administered medications for this visit.   Facility-Administered Medications Ordered in Other Visits  Medication Dose Route Frequency Provider Last Rate Last  Dose  . fludeoxyglucose F - 18 (FDG) injection 12.35 milli Curie  12.35 milli Curie Intravenous Once PRN Idelle Crouch, MD          Review of Systems A complete review of systems was asked and was negative except for the following positive findings loss of appetite, depression, anxiety, easy bruising  Blood pressure 149/82, pulse 101, temperature 97.3 F (36.3 C), temperature source Tympanic, resp. rate 18, height '5\' 2"'$  (1.575 m), weight 116 lb 2.9 oz (52.7 kg).  Physical Exam CONSTITUTIONAL:  Pleasant, well-developed, well-nourished, and in no acute distress. EYES: Pupils equal and reactive to light,  Sclera non-icteric EARS, NOSE, MOUTH AND THROAT:  The oropharynx was clear.  Dentition is good repair.  Oral mucosa pink and moist. LYMPH NODES:  Lymph nodes in the neck and axillae were normal RESPIRATORY:  Lungs were clear.  Normal respiratory effort without pathologic use of accessory muscles of respiration CARDIOVASCULAR: Heart was regular without murmurs.  There were no carotid bruits. GI: The abdomen was soft, nontender, and nondistended. There were no palpable masses. There was no hepatosplenomegaly. There were normal bowel sounds in all quadrants. GU:  Rectal deferred.   MUSCULOSKELETAL:  Normal muscle strength and tone.  No clubbing or cyanosis.   SKIN:  There were no pathologic skin lesions.  There were no nodules on palpation. NEUROLOGIC:  Sensation is normal.  Cranial nerves are grossly intact. PSYCH:  Oriented to person, place and time.  Mood and affect are normal.  Data Reviewed CT scan and PET scan  I have personally reviewed the patient's imaging, laboratory findings and medical records.    Assessment    There is a groundglass opacity in the left upper lobe. I believe that this is most likely an adenocarcinoma.    Plan    We will obtain the pulmonary function studies which were made last week. I had a long discussion with her and her daughter regarding the options. We discussed the role of operative versus nonoperative management. We also discussed the possibility of continued observation and/or follow-up. I explained to the patient that I thought that he would be very reasonable to proceed on with left thoracotomy and left upper lobectomy depending upon the results of her urinary function tests. The patient has a wedding coming up in the next 2 weeks and she would like to get through that before she makes up her mind. I gave both her and her daughter my business card and told him to contact me as soon as the wedding is completed so that we can arrange for additional  follow-up. This was agreeable to both the patient and her daughter.       Nestor Lewandowsky, MD 11/07/2014, 2:20 PM

## 2014-11-07 NOTE — Progress Notes (Signed)
Patient is referred here by Dr. Doy Hutching for abnormal CT Scan. Patient states that she is not having any pain. Patient had PET Scan today.

## 2014-11-13 ENCOUNTER — Telehealth: Payer: Self-pay | Admitting: *Deleted

## 2014-11-13 NOTE — Telephone Encounter (Signed)
Left voicemail with patient regarding appointment with Dr. Baruch Gouty and [facility] Monday 11/17/14 at 1:30pm

## 2014-11-17 ENCOUNTER — Encounter: Payer: Self-pay | Admitting: Radiation Oncology

## 2014-11-17 ENCOUNTER — Inpatient Hospital Stay (HOSPITAL_BASED_OUTPATIENT_CLINIC_OR_DEPARTMENT_OTHER): Payer: Medicare Other | Admitting: Cardiothoracic Surgery

## 2014-11-17 ENCOUNTER — Ambulatory Visit
Admission: RE | Admit: 2014-11-17 | Discharge: 2014-11-17 | Disposition: A | Discharge status: Home / Self Care | Payer: Medicare Other | Source: Ambulatory Visit | Attending: Radiation Oncology | Admitting: Radiation Oncology

## 2014-11-17 ENCOUNTER — Other Ambulatory Visit: Payer: Self-pay | Admitting: *Deleted

## 2014-11-17 VITALS — BP 145/78 | HR 90 | Temp 96.6°F | Resp 18 | Wt 116.0 lb

## 2014-11-17 DIAGNOSIS — R918 Other nonspecific abnormal finding of lung field: Secondary | ICD-10-CM | POA: Diagnosis not present

## 2014-11-17 NOTE — Consult Note (Signed)
Except an outstanding is perfect of Radiation Oncology NEW PATIENT EVALUATION  Name: Krystal Greene  MRN: [id number]  Date:   11/17/2014     DOB: 1935/07/07   This 79 y.o. female patient presents to the clinic for initial evaluation of probable stage I non-small cell lung cancer of the left upper lobe.  REFERRING PHYSICIAN: Idelle Crouch, MD  CHIEF COMPLAINT:  Chief Complaint  Patient presents with  . Lung Cancer    Pt is here for initial consultation for a  lung mass.      DIAGNOSIS: The encounter diagnosis was Lung mass.   PREVIOUS INVESTIGATIONS:  CT scans are reviewed as well as PET/CT scans Clinical notes reviewed CT-guided biopsy ordered  HPI: Patient is a 79 year old female initially seen in the [place] emergency room with complaints of back pain and she's had a history of multiple back procedures in the past secondary to osteoporosis. CT scan at that time showed a left upper lobe groundglass opacity consistent with probable well-differentiated adenocarcinoma. They followed this and increased in size on repeat CT scan. She was seen by Dr. Faith Rogue and treatment options were discussed including left upper lobectomy. She is not yet had a biopsy. She did have pulmonary function tests with preliminary report showing significant COPD. I do not have actual results at present. She is seen today for radiation oncology opinion as she does favor nonsurgical alternatives. She specifically denies cough hemoptysis or chest tightness.  PLANNED TREATMENT REGIMEN: Possible SB RT  PAST MEDICAL HISTORY:  has no past medical history on file.    PAST SURGICAL HISTORY:  Past Surgical History  Procedure Laterality Date  . Back surgery    . Cervical fusion    . Esophageal dilation    . Breast excisional biopsy Right 30+ yrs ago?    neg    FAMILY HISTORY: family history is not on file.  SOCIAL HISTORY:  reports that she has been smoking Cigarettes.  She has been smoking about 1.00 pack  per day. She has never used smokeless tobacco. She reports that she drinks about 0.6 oz of alcohol per week. She reports that she does not use illicit drugs.  ALLERGIES: Hydrocodone  MEDICATIONS:  Current Outpatient Prescriptions  Medication Sig Dispense Refill  . alendronate (FOSAMAX) 70 MG tablet Take by mouth.    . Cholecalciferol (VITAMIN D3) 5000 UNITS TABS Take by mouth.    Marland Kitchen ibuprofen (ADVIL,MOTRIN) 200 MG tablet Take 200 mg by mouth every 6 (six) hours as needed for fever, headache or mild pain.    Marland Kitchen ketorolac (TORADOL) 10 MG tablet Take 1 tablet (10 mg total) by mouth every 6 (six) hours as needed. 20 tablet 0  . lidocaine (LIDODERM) 5 % PLACE 1 PATCH ONTO THE SKIN DAILY 12 HOURS ON 12 HOURS OFF  3  . omeprazole (PRILOSEC) 20 MG capsule Take by mouth.    . oxyCODONE-acetaminophen (ROXICET) 5-325 MG per tablet Take 1 tablet by mouth every 6 (six) hours as needed. 12 tablet 0  . traMADol (ULTRAM) 50 MG tablet Take by mouth.    . vitamin B-12 (CYANOCOBALAMIN) 1000 MCG tablet Take 1,000 mcg by mouth daily.     No current facility-administered medications for this encounter.    ECOG PERFORMANCE STATUS:  0 - Asymptomatic  REVIEW OF SYSTEMS:  Patient denies any weight loss, fatigue, weakness, fever, chills or night sweats. Patient denies any loss of vision, blurred vision. Patient denies any ringing  of the ears or hearing  loss. No irregular heartbeat. Patient denies heart murmur or history of fainting. Patient denies any chest pain or pain radiating to her upper extremities. Patient denies any shortness of breath, difficulty breathing at night, cough or hemoptysis. Patient denies any swelling in the lower legs. Patient denies any nausea vomiting, vomiting of blood, or coffee ground material in the vomitus. Patient denies any stomach pain. Patient states has had normal bowel movements no significant constipation or diarrhea. Patient denies any dysuria, hematuria or significant nocturia.  Patient denies any problems walking, swelling in the joints or loss of balance. Patient denies any skin changes, loss of hair or loss of weight. Patient denies any excessive worrying or anxiety or significant depression. Patient denies any problems with insomnia. Patient denies excessive thirst, polyuria, polydipsia. Patient denies any swollen glands, patient denies easy bruising or easy bleeding. Patient denies any recent infections, allergies or URI. Patient "s visual fields have not changed significantly in recent time.    PHYSICAL EXAM: BP 145/78 mmHg  Pulse 90  Temp(Src) 96.6 F (35.9 C)  Resp 18  Wt 115 lb 15.4 oz (52.6 kg)  LMP  (Approximate) Well-developed well-nourished patient in NAD. HEENT reveals PERLA, EOMI, discs not visualized.  Oral cavity is clear. No oral mucosal lesions are identified. Neck is clear without evidence of cervical or supraclavicular adenopathy. Lungs are clear to A&P. Cardiac examination is essentially unremarkable with regular rate and rhythm without murmur rub or thrill. Abdomen is benign with no organomegaly or masses noted. Motor sensory and DTR levels are equal and symmetric in the upper and lower extremities. Cranial nerves II through XII are grossly intact. Proprioception is intact. No peripheral adenopathy or edema is identified. No motor or sensory levels are noted. Crude visual fields are within normal range.  LABORATORY DATA: Pathology report will be reviewed prior to ordering treatment    RADIOLOGY RESULTS: PET CT and CT scans reviewed   IMPRESSION: Probable stage I non-small cell lung cancer of the left upper lobe  PLAN:At this time patient is favoring SB RT option which I've explained in detail. I have ordered a CT-guided fine-needle aspiration of her left upper lobe lesion to confirm tissue histology. Risks and benefits of SB RT including possible cough fatigue alteration of blood counts slight radiation esophagitis all were discussed in detail  with the patient and her daughter. They both seem to comprehend my treatment plan well. I have set up a follow-up appointment after her CT-guided biopsy. Should that be negative would reevaluate her in about 4-6 months.  I would like to take this opportunity for allowing me to participate in the care of your patient.Armstead Peaks., MD

## 2014-11-17 NOTE — Progress Notes (Signed)
[facility] Post-Op Note  Patient ID: Krystal Greene, female   DOB: 02-02-1935, 79 y.o.   MRN: [id number]  HISTORY: She returns today in follow-up. She did see Dr. Donella Stade earlier today. She also pulmonary function studies and her PET scan. Of independently reviewed those. Her FEV1 is 61% of predicted. Her PET scan shows some low-level uptake in the left upper lobe mass but again was concerning for malignancy. She also continues to smoke and does complain of some shortness of breath.   There were no vitals filed for this visit.   EXAM: Resp: Lungs are clear bilaterally.  No respiratory distress, normal effort. Heart:  Regular without murmurs Abd:  Abdomen is soft, non distended and non tender. No masses are palpable.  There is no rebound and no guarding.  Neurological: Alert and oriented to person, place, and time. Coordination normal.  Skin: Skin is warm and dry. No rash noted. No diaphoretic. No erythema. No pallor.  Psychiatric: Normal mood and affect. Normal behavior. Judgment and thought content normal.    ASSESSMENT: I had a long discussion with her after reviewing with her the results of the PET scan and the pulmonary function studies. At the present time the patient does not wish to undergo any surgical intervention. She did meet with Dr. Donella Stade today and he has arranged for a CT-guided needle biopsy. I did review with her the indications and risks of thoracotomy with lung resection. She stated that given her advanced age she did not wish to pursue any surgical intervention and would like to follow-up with Dr. Donella Stade.   PLAN:   We will make appropriate arrangements for follow-up with Dr. Donella Stade after the CT-guided needle biopsy. No follow-up was made with Dr. Genevive Bi.    Nestor Lewandowsky, MD

## 2014-11-17 NOTE — Progress Notes (Signed)
Patient here for follow up. No complaints today. 

## 2014-11-20 ENCOUNTER — Ambulatory Visit: Payer: Medicare Other | Admitting: Cardiothoracic Surgery

## 2014-11-25 ENCOUNTER — Ambulatory Visit
Admission: RE | Admit: 2014-11-25 | Discharge: 2014-11-25 | Disposition: A | Discharge status: Home / Self Care | Payer: Medicare Other | Source: Ambulatory Visit | Attending: Radiation Oncology | Admitting: Radiation Oncology

## 2014-11-25 DIAGNOSIS — C3412 Malignant neoplasm of upper lobe, left bronchus or lung: Secondary | ICD-10-CM | POA: Diagnosis not present

## 2014-11-25 DIAGNOSIS — R911 Solitary pulmonary nodule: Secondary | ICD-10-CM | POA: Diagnosis present

## 2014-11-25 DIAGNOSIS — Z01812 Encounter for preprocedural laboratory examination: Secondary | ICD-10-CM | POA: Insufficient documentation

## 2014-11-25 DIAGNOSIS — R918 Other nonspecific abnormal finding of lung field: Secondary | ICD-10-CM

## 2014-11-25 HISTORY — DX: Gastro-esophageal reflux disease without esophagitis: K21.9

## 2014-11-25 HISTORY — DX: Age-related osteoporosis without current pathological fracture: M81.0

## 2014-11-25 LAB — CBC
HCT: 40.7 % (ref 35.0–47.0)
Hemoglobin: 13.9 g/dL (ref 12.0–16.0)
MCH: 30.5 pg (ref 26.0–34.0)
MCHC: 34.2 g/dL (ref 32.0–36.0)
MCV: 89 fL (ref 80.0–100.0)
Platelets: 328 10*3/uL (ref 150–440)
RBC: 4.57 MIL/uL (ref 3.80–5.20)
RDW: 13.6 % (ref 11.5–14.5)
WBC: 9 10*3/uL (ref 3.6–11.0)

## 2014-11-25 LAB — PROTIME-INR
INR: 0.94
Prothrombin Time: 12.8 s (ref 11.4–15.0)

## 2014-11-25 LAB — APTT: aPTT: 29 s (ref 24–36)

## 2014-11-25 MED ORDER — MIDAZOLAM HCL 5 MG/5ML IJ SOLN
INTRAMUSCULAR | Status: AC | PRN
  Administered 2014-11-25: 1 mg via INTRAVENOUS
  Administered 2014-11-25 (×2): 0.5 mg via INTRAVENOUS

## 2014-11-25 MED ORDER — HYDRALAZINE HCL 20 MG/ML IJ SOLN
INTRAMUSCULAR | Status: AC
  Filled 2014-11-25: qty 1

## 2014-11-25 MED ORDER — SODIUM CHLORIDE 0.9 % IV SOLN
INTRAVENOUS | Status: DC
  Administered 2014-11-25: 09:00:00 via INTRAVENOUS

## 2014-11-25 MED ORDER — FENTANYL CITRATE (PF) 100 MCG/2ML IJ SOLN
INTRAMUSCULAR | Status: AC | PRN
Start: 2014-11-25 — End: 2014-11-25
  Administered 2014-11-25: 50 ug via INTRAVENOUS
  Administered 2014-11-25: 25 ug via INTRAVENOUS

## 2014-11-25 MED ORDER — HEPARIN SOD (PORK) LOCK FLUSH 100 UNIT/ML IV SOLN
INTRAVENOUS | Status: AC
  Filled 2014-11-25: qty 5

## 2014-11-25 NOTE — Sedation Documentation (Signed)
Dr. Kathlene Cote notified of BP-hydralazine on stand-by.

## 2014-11-25 NOTE — Procedures (Signed)
Interventional Radiology Procedure Note  Procedure:  CT guided core biopsy of left lung nodule  Complications:  Regional hemorrhage  Estimated Blood Loss: < 25 mL  17 G needle advanced to LUL ground glass nodule.  Single 18 G core biopsy sample obtained. Biosentry device used.  No PTX. Will recover and monitor.  No hemoptysis currently.  Venetia Night. Kathlene Cote, M.D Pager:  [phone]

## 2014-11-26 ENCOUNTER — Telehealth: Payer: Self-pay | Admitting: *Deleted

## 2014-11-26 LAB — SURGICAL PATHOLOGY

## 2014-11-26 NOTE — Telephone Encounter (Signed)
Received call from Dr. Luana Shu with left lung biopsy results of Adenocarcinoma.  Dr. Baruch Gouty notified.  Patient has an appointment to see MD on 12/01/14.

## 2014-12-01 ENCOUNTER — Ambulatory Visit
Admission: RE | Admit: 2014-12-01 | Discharge: 2014-12-01 | Disposition: A | Discharge status: Home / Self Care | Payer: Medicare Other | Source: Ambulatory Visit | Attending: Radiation Oncology | Admitting: Radiation Oncology

## 2014-12-01 ENCOUNTER — Encounter: Payer: Self-pay | Admitting: Radiation Oncology

## 2014-12-01 VITALS — BP 133/75 | HR 94 | Temp 97.6°F | Wt 115.4 lb

## 2014-12-01 DIAGNOSIS — Z510 Encounter for antineoplastic radiation therapy: Secondary | ICD-10-CM | POA: Insufficient documentation

## 2014-12-01 DIAGNOSIS — R918 Other nonspecific abnormal finding of lung field: Secondary | ICD-10-CM

## 2014-12-01 DIAGNOSIS — C3412 Malignant neoplasm of upper lobe, left bronchus or lung: Secondary | ICD-10-CM | POA: Insufficient documentation

## 2014-12-01 NOTE — Progress Notes (Signed)
Radiation Oncology Follow up Note  Name: Krystal Greene   Date:   12/01/2014 MRN:  [id number] DOB: [birth year]    This 79 y.o. female presents to the clinic today for biopsy of left upper lobe lesion.  REFERRING PROVIDER: Idelle Crouch, MD  HPI: Patient is a 79 year old female presented with back pain head CT scan showing left upper lobe groundglass lesion worrisome for malignancy. He did increase on repeat serial CT scans. She was seen for opinion and underwent CT-guided biopsy which was positive for adenocarcinoma with lipidic features. She's having some postbiopsy pain in her left shoulder otherwise asymptomatic. She seen today for further opinion. She specifically denies cough hemoptysis or chest tightness.   COMPLICATIONS OF TREATMENT: none  FOLLOW UP COMPLIANCE: keeps appointments   PHYSICAL EXAM:  BP 133/75 mmHg  Pulse 94  Temp(Src) 97.6 F (36.4 C)  Wt 115 lb 6.6 oz (52.35 kg)  LMP  (Approximate) Well-developed well-nourished patient in NAD. HEENT reveals PERLA, EOMI, discs not visualized.  Oral cavity is clear. No oral mucosal lesions are identified. Neck is clear without evidence of cervical or supraclavicular adenopathy. Lungs are clear to A&P. Cardiac examination is essentially unremarkable with regular rate and rhythm without murmur rub or thrill. Abdomen is benign with no organomegaly or masses noted. Motor sensory and DTR levels are equal and symmetric in the upper and lower extremities. Cranial nerves II through XII are grossly intact. Proprioception is intact. No peripheral adenopathy or edema is identified. No motor or sensory levels are noted. Crude visual fields are within normal range.  RADIOLOGY RESULTS: CT scan is reviewed, pathology report is reviewed  PLAN: At this time I have recommended to go ahead with SB RT. The plan on delivering 5000 cGy in 5 fractions to her left upper lobe lesion. Risks and benefits of treatment including possible slight chance of  dysphasia secondary radiation esophagitis, slight chance of skin reaction fatigue cough all were explained in detail to the patient and her daughter. They both seem to comprehend my treatment plan well. I have set up and ordered CT simulation for SB RT with motion inhibition protocol.  I would like to take this opportunity for allowing me to participate in the care of your patient.Armstead Peaks., MD

## 2014-12-08 ENCOUNTER — Ambulatory Visit: Admission: RE | Admit: 2014-12-08 | Payer: Medicare Other | Source: Ambulatory Visit

## 2014-12-08 DIAGNOSIS — Z510 Encounter for antineoplastic radiation therapy: Secondary | ICD-10-CM | POA: Diagnosis not present

## 2014-12-08 DIAGNOSIS — C3412 Malignant neoplasm of upper lobe, left bronchus or lung: Secondary | ICD-10-CM | POA: Diagnosis not present

## 2014-12-10 DIAGNOSIS — Z510 Encounter for antineoplastic radiation therapy: Secondary | ICD-10-CM | POA: Diagnosis not present

## 2014-12-16 DIAGNOSIS — Z510 Encounter for antineoplastic radiation therapy: Secondary | ICD-10-CM | POA: Diagnosis not present

## 2014-12-23 ENCOUNTER — Ambulatory Visit
Admission: RE | Admit: 2014-12-23 | Discharge: 2014-12-23 | Disposition: A | Discharge status: Home / Self Care | Payer: Medicare Other | Source: Ambulatory Visit | Attending: Radiation Oncology | Admitting: Radiation Oncology

## 2014-12-23 DIAGNOSIS — Z510 Encounter for antineoplastic radiation therapy: Secondary | ICD-10-CM | POA: Diagnosis not present

## 2014-12-25 ENCOUNTER — Ambulatory Visit
Admission: RE | Admit: 2014-12-25 | Discharge: 2014-12-25 | Disposition: A | Discharge status: Home / Self Care | Payer: Medicare Other | Source: Ambulatory Visit | Attending: Radiation Oncology | Admitting: Radiation Oncology

## 2014-12-25 DIAGNOSIS — Z510 Encounter for antineoplastic radiation therapy: Secondary | ICD-10-CM | POA: Diagnosis not present

## 2014-12-30 ENCOUNTER — Ambulatory Visit
Admission: RE | Admit: 2014-12-30 | Discharge: 2014-12-30 | Disposition: A | Discharge status: Home / Self Care | Payer: Medicare Other | Source: Ambulatory Visit | Attending: Radiation Oncology | Admitting: Radiation Oncology

## 2014-12-30 DIAGNOSIS — Z510 Encounter for antineoplastic radiation therapy: Secondary | ICD-10-CM | POA: Diagnosis not present

## 2015-01-01 ENCOUNTER — Ambulatory Visit: Payer: Medicare Other | Admitting: Radiation Oncology

## 2015-01-06 ENCOUNTER — Ambulatory Visit
Admission: RE | Admit: 2015-01-06 | Discharge: 2015-01-06 | Disposition: A | Discharge status: Home / Self Care | Payer: Medicare Other | Source: Ambulatory Visit | Attending: Radiation Oncology | Admitting: Radiation Oncology

## 2015-01-06 DIAGNOSIS — Z510 Encounter for antineoplastic radiation therapy: Secondary | ICD-10-CM | POA: Diagnosis not present

## 2015-01-08 ENCOUNTER — Ambulatory Visit
Admission: RE | Admit: 2015-01-08 | Discharge: 2015-01-08 | Disposition: A | Discharge status: Home / Self Care | Payer: Medicare Other | Source: Ambulatory Visit | Attending: Radiation Oncology | Admitting: Radiation Oncology

## 2015-01-08 DIAGNOSIS — Z510 Encounter for antineoplastic radiation therapy: Secondary | ICD-10-CM | POA: Diagnosis not present

## 2015-02-18 ENCOUNTER — Encounter: Payer: Self-pay | Admitting: Radiation Oncology

## 2015-02-18 ENCOUNTER — Ambulatory Visit
Admission: RE | Admit: 2015-02-18 | Discharge: 2015-02-18 | Disposition: A | Discharge status: Home / Self Care | Payer: Medicare Other | Source: Ambulatory Visit | Attending: Radiation Oncology | Admitting: Radiation Oncology

## 2015-02-18 ENCOUNTER — Other Ambulatory Visit: Payer: Self-pay | Admitting: *Deleted

## 2015-02-18 VITALS — BP 154/76 | HR 89 | Temp 97.2°F | Resp 18 | Wt 111.2 lb

## 2015-02-18 DIAGNOSIS — C3412 Malignant neoplasm of upper lobe, left bronchus or lung: Secondary | ICD-10-CM

## 2015-02-18 HISTORY — DX: Malignant neoplasm of unspecified part of unspecified bronchus or lung: C34.90

## 2015-02-18 NOTE — Progress Notes (Signed)
Radiation Oncology Follow up Note  Name: Krystal Greene   Date:   02/18/2015 MRN:  [id number] DOB: 1935/08/12    This 80 y.o. 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 Seen today in routine follow-up she is really doing well specifically denies hemoptysis chest tightness or significant fatigue. She does have a slight cough more prominent in the morning nonproductive. Has not yet had a CT scan..  COMPLICATIONS OF TREATMENT: none  FOLLOW UP COMPLIANCE: keeps appointments   PHYSICAL EXAM:  BP 154/76 mmHg  Pulse 89  Temp(Src) 97.2 F (36.2 C)  Resp 18  Wt 111 lb 3.6 oz (50.45 kg)  LMP  (Approximate) Well-developed well-nourished patient in NAD. HEENT reveals PERLA, EOMI, discs not visualized.  Oral cavity is clear. No oral mucosal lesions are identified. Neck is clear without evidence of cervical or supraclavicular adenopathy. Lungs are clear to A&P. Cardiac examination is essentially unremarkable with regular rate and rhythm without murmur rub or thrill. Abdomen is benign with no organomegaly or masses noted. Motor sensory and DTR levels are equal and symmetric in the upper and lower extremities. Cranial nerves II through XII are grossly intact. Proprioception is intact. No peripheral adenopathy or edema is identified. No motor or sensory levels are noted. Crude visual fields are within normal range.  RADIOLOGY RESULTS: CT scan of the chest with contrast has been ordered in 2 months  PLAN: Present time she is doing extremely well without significant side effect. I'm please were overall progress. I've ordered a follow-up CT scan with contrast in about 2 months and will see her shortly thereafter in follow-up to review.  Patient's [place] by her daughter today she continues to do extremely well patient knows to call with any concerns.  I would like to take this opportunity for allowing me to participate in the care of your patient.Armstead Peaks., MD

## 2015-05-12 ENCOUNTER — Ambulatory Visit: Admission: RE | Admit: 2015-05-12 | Payer: Medicare Other | Source: Ambulatory Visit

## 2015-05-20 ENCOUNTER — Other Ambulatory Visit: Payer: Self-pay | Admitting: *Deleted

## 2015-05-20 ENCOUNTER — Ambulatory Visit
Admission: RE | Admit: 2015-05-20 | Discharge: 2015-05-20 | Disposition: A | Discharge status: Home / Self Care | Payer: Medicare Other | Source: Ambulatory Visit | Attending: Radiation Oncology | Admitting: Radiation Oncology

## 2015-05-20 ENCOUNTER — Encounter: Payer: Self-pay | Admitting: Radiation Oncology

## 2015-05-20 VITALS — BP 140/79 | HR 97 | Temp 96.5°F | Wt 109.0 lb

## 2015-05-20 DIAGNOSIS — C3412 Malignant neoplasm of upper lobe, left bronchus or lung: Secondary | ICD-10-CM

## 2015-05-20 NOTE — Progress Notes (Signed)
Radiation Oncology Follow up Note  Name: Krystal Greene   Date:   05/20/2015 MRN:  [id number] DOB: Jun 02, 1935    This 80 y.o. female presents to the clinic today for follow-up for left upper lobe stage I adenocarcinoma now out 4 months status post SB RT.  REFERRING PROVIDER: Idelle Crouch, MD  HPI: Patient is a 80 year old female now out 4 months having completed SB RT to a left upper lobe lipidic type adenocarcinoma. She is seen today in routine follow-up is doing well. She specifically denies cough hemoptysis or chest tightness.Marland Kitchen She was scheduled for repeat CT scan although she missed her appointment has been rescheduled for early next month.  COMPLICATIONS OF TREATMENT: none  FOLLOW UP COMPLIANCE: keeps appointments   PHYSICAL EXAM:  BP 140/79 mmHg  Pulse 97  Temp(Src) 96.5 F (35.8 C)  Wt 109 lb 0.3 oz (49.45 kg)  LMP  (Approximate) Elderly Well-developed well-nourished patient in NAD. HEENT reveals PERLA, EOMI, discs not visualized.  Oral cavity is clear. No oral mucosal lesions are identified. Neck is clear without evidence of cervical or supraclavicular adenopathy. Lungs are clear to A&P. Cardiac examination is essentially unremarkable with regular rate and rhythm without murmur rub or thrill. Abdomen is benign with no organomegaly or masses noted. Motor sensory and DTR levels are equal and symmetric in the upper and lower extremities. Cranial nerves II through XII are grossly intact. Proprioception is intact. No peripheral adenopathy or edema is identified. No motor or sensory levels are noted. Crude visual fields are within normal range.  RADIOLOGY RESULTS: CT scan scheduled for next week which I will review  PLAN: At the present time clinically she is doing well I anticipate reviewing her CT scan was performed next week. Otherwise I'm please were overall progress. I've asked to see her back in the proximal me 6 months for follow-up. Should her film changes indicate any  significant worsening will speed up a follow-up appointments accordingly. Patient is to call sooner with any concerns.  I would like to take this opportunity for allowing me to participate in the care of your patient.Armstead Peaks., MD

## 2015-05-27 ENCOUNTER — Ambulatory Visit
Admission: RE | Admit: 2015-05-27 | Discharge: 2015-05-27 | Disposition: A | Discharge status: Home / Self Care | Payer: Medicare Other | Source: Ambulatory Visit | Attending: Radiation Oncology | Admitting: Radiation Oncology

## 2015-05-27 DIAGNOSIS — C3412 Malignant neoplasm of upper lobe, left bronchus or lung: Secondary | ICD-10-CM

## 2015-05-27 MED ORDER — IOPAMIDOL (ISOVUE-300) INJECTION 61%
75.0000 mL | Freq: Once | INTRAVENOUS | Status: AC | PRN
  Administered 2015-05-27: 75 mL via INTRAVENOUS

## 2015-12-09 ENCOUNTER — Other Ambulatory Visit: Payer: Self-pay | Admitting: *Deleted

## 2015-12-09 ENCOUNTER — Ambulatory Visit
Admission: RE | Admit: 2015-12-09 | Discharge: 2015-12-09 | Disposition: A | Discharge status: Home / Self Care | Payer: Medicare Other | Source: Ambulatory Visit | Attending: Radiation Oncology | Admitting: Radiation Oncology

## 2015-12-09 ENCOUNTER — Encounter: Payer: Self-pay | Admitting: Radiation Oncology

## 2015-12-09 VITALS — BP 127/78 | HR 95 | Temp 97.0°F | Resp 20 | Wt 109.1 lb

## 2015-12-09 DIAGNOSIS — C3412 Malignant neoplasm of upper lobe, left bronchus or lung: Secondary | ICD-10-CM

## 2015-12-09 DIAGNOSIS — Z923 Personal history of irradiation: Secondary | ICD-10-CM | POA: Insufficient documentation

## 2015-12-09 DIAGNOSIS — F17210 Nicotine dependence, cigarettes, uncomplicated: Secondary | ICD-10-CM | POA: Diagnosis not present

## 2015-12-09 NOTE — Progress Notes (Signed)
Radiation Oncology Follow up Note  Name: Krystal Greene   Date:   12/09/2015 MRN:  [id number] DOB: 1935/02/09    This 80 y.o. female presents to the clinic today for 10 month follow-up status post SB RT for stage I left upper lobe adenocarcinoma.  REFERRING PROVIDER: Idelle Crouch, MD  HPI: Patient is a 80 year old female now out 10 months having completed SB RT to her left upper lobe for stage I adenocarcinoma. She seen today in routine follow-up is doing well. She specifically denies cough chest tightness or any significant dyspnea on exertion. She had a CT scan in May showing. Conversion of groundglass nodularity to more solid-appearing nodule in the left upper lobe palpable with radiation change. Weight is stable no dysphagia.  COMPLICATIONS OF TREATMENT: none  FOLLOW UP COMPLIANCE: keeps appointments   PHYSICAL EXAM:  BP 127/78   Pulse 95   Temp 97 F (36.1 C)   Resp 20   Wt 109 lb 2 oz (49.5 kg)   LMP  (Approximate)   BMI 19.33 kg/m  Well-developed well-nourished patient in NAD. HEENT reveals PERLA, EOMI, discs not visualized.  Oral cavity is clear. No oral mucosal lesions are identified. Neck is clear without evidence of cervical or supraclavicular adenopathy. Lungs are clear to A&P. Cardiac examination is essentially unremarkable with regular rate and rhythm without murmur rub or thrill. Abdomen is benign with no organomegaly or masses noted. Motor sensory and DTR levels are equal and symmetric in the upper and lower extremities. Cranial nerves II through XII are grossly intact. Proprioception is intact. No peripheral adenopathy or edema is identified. No motor or sensory levels are noted. Crude visual fields are within normal range.  RADIOLOGY RESULTS: Last CT scan from May 2017 reviewed and compatible with the above-stated findings  PLAN: Present time patient is doing well. I've asked to see her back in 6 months for follow-up and ordered a repeat CT scan prior to that  visit. I'm please were overall progress. Patient knows to call with any concerns.  I would like to take this opportunity to thank you for allowing me to participate in the care of your patient.Armstead Peaks., MD

## 2016-06-14 ENCOUNTER — Ambulatory Visit: Admission: RE | Admit: 2016-06-14 | Payer: Medicare Other | Source: Ambulatory Visit

## 2016-06-30 ENCOUNTER — Ambulatory Visit
Admission: RE | Admit: 2016-06-30 | Discharge: 2016-06-30 | Disposition: A | Discharge status: Home / Self Care | Payer: Medicare Other | Source: Ambulatory Visit | Attending: Radiation Oncology | Admitting: Radiation Oncology

## 2016-06-30 ENCOUNTER — Encounter: Payer: Self-pay | Admitting: Radiation Oncology

## 2016-06-30 ENCOUNTER — Other Ambulatory Visit: Payer: Self-pay | Admitting: *Deleted

## 2016-06-30 VITALS — BP 156/76 | HR 58 | Temp 97.2°F | Wt 104.8 lb

## 2016-06-30 DIAGNOSIS — F17210 Nicotine dependence, cigarettes, uncomplicated: Secondary | ICD-10-CM | POA: Diagnosis not present

## 2016-06-30 DIAGNOSIS — C3412 Malignant neoplasm of upper lobe, left bronchus or lung: Secondary | ICD-10-CM | POA: Insufficient documentation

## 2016-06-30 DIAGNOSIS — Z923 Personal history of irradiation: Secondary | ICD-10-CM | POA: Diagnosis not present

## 2016-06-30 NOTE — Progress Notes (Signed)
Radiation Oncology Follow up Note  Name: Krystal Greene   Date:   06/30/2016 MRN:  [id number] DOB: [birth year]    This 81 y.o. female presents to the clinic today for 60 min follow-up status post SB RT for a stage I adenocarcinoma of the left upper lobe.  REFERRING PROVIDER: Idelle Crouch, MD  HPI: patient is a 82 year old female now out 16 months having completed SB RT to her left upper lobe for a stage I adenocarcinoma. She seen today in routine follow-up and is doing well. She specifically denies cough hemoptysis chest tightness or any change in her pulmonary status. Her last CT scan was[facility] in May 2017 showing excellent response to radiation therapy.we ordered a CT scan 6 months prior although patient never showed for that appointment. I have reordered at CT scan today.  COMPLICATIONS OF TREATMENT: none  FOLLOW UP COMPLIANCE: keeps appointments   PHYSICAL EXAM:  BP (!) 156/76   Pulse (!) 58   Temp 97.2 F (36.2 C)   Wt 104 lb 13.3 oz (47.6 kg)   BMI 18.57 kg/m  Well-developed well-nourished patient in NAD. HEENT reveals PERLA, EOMI, discs not visualized.  Oral cavity is clear. No oral mucosal lesions are identified. Neck is clear without evidence of cervical or supraclavicular adenopathy. Lungs are clear to A&P. Cardiac examination is essentially unremarkable with regular rate and rhythm without murmur rub or thrill. Abdomen is benign with no organomegaly or masses noted. Motor sensory and DTR levels are equal and symmetric in the upper and lower extremities. Cranial nerves II through XII are grossly intact. Proprioception is intact. No peripheral adenopathy or edema is identified. No motor or sensory levels are noted. Crude visual fields are within normal range.  RADIOLOGY RESULTS: CT scan ordered and will be reviewed  PLAN: resent time patient is doing well. I stressed to her how important is to have a follow-up CT scan and she will have that done in the next several weeks.  Otherwise I've asked to see her back in 1 year for follow-up. Should there be any change in her CT scan may alter my follow-up schedule. Patient knows to call at anytime for any concerns.  I would like to take this opportunity to thank you for allowing me to participate in the care of your patient.Armstead Peaks., MD

## 2016-07-13 ENCOUNTER — Ambulatory Visit
Admission: RE | Admit: 2016-07-13 | Discharge: 2016-07-13 | Disposition: A | Discharge status: Home / Self Care | Payer: Medicare Other | Source: Ambulatory Visit | Attending: Radiation Oncology | Admitting: Radiation Oncology

## 2016-07-13 DIAGNOSIS — I2510 Atherosclerotic heart disease of native coronary artery without angina pectoris: Secondary | ICD-10-CM | POA: Insufficient documentation

## 2016-07-13 DIAGNOSIS — C3412 Malignant neoplasm of upper lobe, left bronchus or lung: Secondary | ICD-10-CM | POA: Diagnosis not present

## 2016-07-13 DIAGNOSIS — R911 Solitary pulmonary nodule: Secondary | ICD-10-CM | POA: Diagnosis not present

## 2016-07-13 DIAGNOSIS — I700 Atherosclerosis of aorta: Secondary | ICD-10-CM | POA: Insufficient documentation

## 2016-07-13 LAB — POCT I-STAT CREATININE: Creatinine, Ser: 0.7 mg/dL (ref 0.44–1.00)

## 2016-07-13 MED ORDER — IOPAMIDOL (ISOVUE-300) INJECTION 61%
75.0000 mL | Freq: Once | INTRAVENOUS | Status: AC | PRN
  Administered 2016-07-13: 75 mL via INTRAVENOUS

## 2017-03-30 ENCOUNTER — Inpatient Hospital Stay
Admission: EM | Admit: 2017-03-30 | Discharge: 2017-04-01 | DRG: 183 | Disposition: A | Discharge status: Home / Self Care | Payer: Medicare Other | Attending: Internal Medicine | Admitting: Internal Medicine

## 2017-03-30 ENCOUNTER — Encounter: Payer: Self-pay | Admitting: Emergency Medicine

## 2017-03-30 ENCOUNTER — Emergency Department: Payer: Medicare Other

## 2017-03-30 ENCOUNTER — Other Ambulatory Visit: Payer: Self-pay

## 2017-03-30 DIAGNOSIS — E871 Hypo-osmolality and hyponatremia: Secondary | ICD-10-CM | POA: Diagnosis present

## 2017-03-30 DIAGNOSIS — S2242XA Multiple fractures of ribs, left side, initial encounter for closed fracture: Secondary | ICD-10-CM | POA: Diagnosis not present

## 2017-03-30 DIAGNOSIS — R079 Chest pain, unspecified: Secondary | ICD-10-CM | POA: Diagnosis not present

## 2017-03-30 DIAGNOSIS — F17210 Nicotine dependence, cigarettes, uncomplicated: Secondary | ICD-10-CM | POA: Diagnosis present

## 2017-03-30 DIAGNOSIS — K219 Gastro-esophageal reflux disease without esophagitis: Secondary | ICD-10-CM | POA: Diagnosis present

## 2017-03-30 DIAGNOSIS — W010XXA Fall on same level from slipping, tripping and stumbling without subsequent striking against object, initial encounter: Secondary | ICD-10-CM | POA: Diagnosis present

## 2017-03-30 DIAGNOSIS — J449 Chronic obstructive pulmonary disease, unspecified: Secondary | ICD-10-CM | POA: Diagnosis present

## 2017-03-30 DIAGNOSIS — Z7983 Long term (current) use of bisphosphonates: Secondary | ICD-10-CM

## 2017-03-30 DIAGNOSIS — S22009A Unspecified fracture of unspecified thoracic vertebra, initial encounter for closed fracture: Secondary | ICD-10-CM

## 2017-03-30 DIAGNOSIS — Z79899 Other long term (current) drug therapy: Secondary | ICD-10-CM

## 2017-03-30 DIAGNOSIS — Z85118 Personal history of other malignant neoplasm of bronchus and lung: Secondary | ICD-10-CM

## 2017-03-30 DIAGNOSIS — M810 Age-related osteoporosis without current pathological fracture: Secondary | ICD-10-CM | POA: Diagnosis present

## 2017-03-30 DIAGNOSIS — Y92002 Bathroom of unspecified non-institutional (private) residence single-family (private) house as the place of occurrence of the external cause: Secondary | ICD-10-CM

## 2017-03-30 DIAGNOSIS — K5900 Constipation, unspecified: Secondary | ICD-10-CM | POA: Diagnosis present

## 2017-03-30 DIAGNOSIS — Z7982 Long term (current) use of aspirin: Secondary | ICD-10-CM

## 2017-03-30 DIAGNOSIS — I10 Essential (primary) hypertension: Secondary | ICD-10-CM | POA: Diagnosis present

## 2017-03-30 DIAGNOSIS — Z885 Allergy status to narcotic agent status: Secondary | ICD-10-CM

## 2017-03-30 DIAGNOSIS — J9601 Acute respiratory failure with hypoxia: Secondary | ICD-10-CM | POA: Diagnosis present

## 2017-03-30 LAB — BASIC METABOLIC PANEL
Anion gap: 8 (ref 5–15)
BUN: 13 mg/dL (ref 6–20)
CHLORIDE: 97 mmol/L — AB (ref 101–111)
CO2: 27 mmol/L (ref 22–32)
Calcium: 8.7 mg/dL — ABNORMAL LOW (ref 8.9–10.3)
Creatinine, Ser: 0.62 mg/dL (ref 0.44–1.00)
GFR calc Af Amer: >60 mL/min (ref >60)
GFR calc non Af Amer: >60 mL/min (ref >60)
GLUCOSE: 150 mg/dL — AB (ref 65–99)
Potassium: 4.4 mmol/L (ref 3.5–5.1)
Sodium: 132 mmol/L — ABNORMAL LOW (ref 135–145)

## 2017-03-30 LAB — CBC WITH DIFFERENTIAL/PLATELET
Basophils Absolute: 0.1 10*3/uL (ref 0–0.1)
Basophils Relative: 0 %
EOS PCT: 0 %
Eosinophils Absolute: 0 10*3/uL (ref 0–0.7)
HCT: 39.5 % (ref 35.0–47.0)
Hemoglobin: 13.5 g/dL (ref 12.0–16.0)
LYMPHS ABS: 1 10*3/uL (ref 1.0–3.6)
LYMPHS PCT: 8 %
MCH: 30.2 pg (ref 26.0–34.0)
MCHC: 34.2 g/dL (ref 32.0–36.0)
MCV: 88.5 fL (ref 80.0–100.0)
MONO ABS: 0.4 10*3/uL (ref 0.2–0.9)
MONOS PCT: 3 %
Neutro Abs: 11.5 10*3/uL — ABNORMAL HIGH (ref 1.4–6.5)
Neutrophils Relative %: 89 %
PLATELETS: 282 10*3/uL (ref 150–440)
RBC: 4.46 MIL/uL (ref 3.80–5.20)
RDW: 13.2 % (ref 11.5–14.5)
WBC: 12.9 10*3/uL — ABNORMAL HIGH (ref 3.6–11.0)

## 2017-03-30 MED ORDER — ACETAMINOPHEN 650 MG RE SUPP: 650.0000 mg | Freq: Four times a day (QID) | RECTAL | Status: DC | PRN

## 2017-03-30 MED ORDER — LIDOCAINE 5 % EX PTCH
1.0000 | MEDICATED_PATCH | CUTANEOUS | Status: DC
  Administered 2017-03-30 – 2017-04-01 (×3): 1 via TRANSDERMAL
  Filled 2017-03-30 (×3): qty 1

## 2017-03-30 MED ORDER — NAPROXEN 375 MG PO TABS
375.0000 mg | ORAL_TABLET | Freq: Once | ORAL | Status: AC
  Administered 2017-03-30: 375 mg via ORAL
  Filled 2017-03-30: qty 1

## 2017-03-30 MED ORDER — BISACODYL 5 MG PO TBEC: 5.0000 mg | DELAYED_RELEASE_TABLET | Freq: Every day | ORAL | Status: DC | PRN

## 2017-03-30 MED ORDER — MORPHINE SULFATE (PF) 2 MG/ML IV SOLN
2.0000 mg | INTRAVENOUS | Status: DC | PRN
Start: 2017-03-30 — End: 2017-03-31
  Administered 2017-03-30 – 2017-03-31 (×3): 2 mg via INTRAVENOUS
  Filled 2017-03-30 (×3): qty 1

## 2017-03-30 MED ORDER — ONDANSETRON HCL 4 MG PO TABS: 4.0000 mg | ORAL_TABLET | Freq: Four times a day (QID) | ORAL | Status: DC | PRN

## 2017-03-30 MED ORDER — DOCUSATE SODIUM 100 MG PO CAPS
100.0000 mg | ORAL_CAPSULE | Freq: Two times a day (BID) | ORAL | Status: DC
  Administered 2017-03-30 – 2017-03-31 (×2): 100 mg via ORAL
  Filled 2017-03-30 (×2): qty 1

## 2017-03-30 MED ORDER — VITAMIN B-12 1000 MCG PO TABS
1000.0000 ug | ORAL_TABLET | Freq: Every day | ORAL | Status: DC
  Administered 2017-03-31 – 2017-04-01 (×2): 1000 ug via ORAL
  Filled 2017-03-30 (×2): qty 1

## 2017-03-30 MED ORDER — BISOPROLOL-HYDROCHLOROTHIAZIDE 5-6.25 MG PO TABS
1.0000 | ORAL_TABLET | Freq: Every day | ORAL | Status: DC
  Administered 2017-03-31 – 2017-04-01 (×2): 1 via ORAL
  Filled 2017-03-30 (×3): qty 1

## 2017-03-30 MED ORDER — ONDANSETRON HCL 4 MG/2ML IJ SOLN
4.0000 mg | Freq: Four times a day (QID) | INTRAMUSCULAR | Status: DC | PRN
  Administered 2017-03-31: 08:00:00 4 mg via INTRAVENOUS
  Filled 2017-03-30: qty 2

## 2017-03-30 MED ORDER — ACETAMINOPHEN 325 MG PO TABS
650.0000 mg | ORAL_TABLET | Freq: Four times a day (QID) | ORAL | Status: DC | PRN
  Administered 2017-03-31: 650 mg via ORAL
  Filled 2017-03-30: qty 2

## 2017-03-30 MED ORDER — MORPHINE SULFATE (PF) 2 MG/ML IV SOLN: 2.0000 mg | INTRAVENOUS | Status: DC | PRN

## 2017-03-30 MED ORDER — HEPARIN SODIUM (PORCINE) 5000 UNIT/ML IJ SOLN
5000.0000 [IU] | Freq: Three times a day (TID) | INTRAMUSCULAR | Status: DC
  Administered 2017-03-30 – 2017-04-01 (×6): 5000 [IU] via SUBCUTANEOUS
  Filled 2017-03-30 (×6): qty 1

## 2017-03-30 NOTE — ED Notes (Signed)
Pt given pillow.  Waiting on results of imaging. Family at bedside. No other needs at this time.

## 2017-03-30 NOTE — ED Notes (Addendum)
Pt ambulated in hallway with standby assist. Pt walks with steady gait, however O2 sat drops to 87% on room air both while walking and at rest in bed. Pt placed on 2L O2 via n/c with O2 sat 94%. [place] notified.

## 2017-03-30 NOTE — ED Triage Notes (Signed)
Pt arrived via EMS from home where EMS reports pt fell into toilet, landing on her left side/back and ribs. MD at bedside for further evaluation.

## 2017-03-30 NOTE — ED Notes (Signed)
Kathlee Nations, RN called to take patient to the floor.

## 2017-03-30 NOTE — ED Notes (Signed)
Admitting provider at bedside.

## 2017-03-30 NOTE — H&P (Signed)
[facility] [place] NAME: Krystal Greene    MR#:  [id number]  DATE OF BIRTH:  1935/09/30  DATE OF ADMISSION:  03/30/2017  PRIMARY CARE PHYSICIAN: Idelle Crouch, MD   REQUESTING/REFERRING PHYSICIAN: Dr. Joni Fears  CHIEF COMPLAINT: Fall   Chief Complaint  Patient presents with  . Fall    HISTORY OF PRESENT ILLNESS:  Krystal Greene  is a 82 y.o. female with a known history of lung cancer who lives alone had  mechanical fall last night fell on her left side.  Patient went to bed and this morning she had severe pain on left side of the chest, patient found to have 3 rib fractures on the left side but no pneumothorax.  Patient desaturated to 87% on room air when she stood up.  She has history of COPD, continues to smoke about 1 pack of cigarettes a day.  She denies any other complaints.  He says that having pain in the left side of the chest only when she moves if she does not move she does not have pain.  PAST MEDICAL HISTORY:   Past Medical History:  Diagnosis Date  . GERD (gastroesophageal reflux disease)   . Lung cancer ([place])   . Osteoporosis     PAST SURGICAL HISTOIRY:   Past Surgical History:  Procedure Laterality Date  . BACK SURGERY    . BREAST EXCISIONAL BIOPSY Right 30+ yrs ago?   neg  . CERVICAL FUSION    . ESOPHAGEAL DILATION      SOCIAL HISTORY:   Social History   Tobacco Use  . Smoking status: Current Every Day Smoker    Packs/day: 1.00    Types: Cigarettes  . Smokeless tobacco: Never Used  Substance Use Topics  . Alcohol use: Yes    Alcohol/week: 0.6 oz    Types: 1 Glasses of wine per week    Comment: nightly    FAMILY HISTORY:  History reviewed. No pertinent family history.  DRUG ALLERGIES:   Allergies  Allergen Reactions  . Hydrocodone Other (See Comments)    Syncope     REVIEW OF SYSTEMS:  CONSTITUTIONAL: No fever, fatigue or weakness.  EYES: No blurred or double vision.  EARS,  NOSE, AND THROAT: No tinnitus or ear pain.  RESPIRATORY: No cough, shortness of breath, wheezing or hemoptysis.  CARDIOVASCULAR: pleuritic chest pain on the left side. GASTROINTESTINAL: No nausea, vomiting, diarrhea or abdominal pain.  GENITOURINARY: No dysuria, hematuria.  ENDOCRINE: No polyuria, nocturia,  HEMATOLOGY: No anemia, easy bruising or bleeding SKIN: No rash or lesion. MUSCULOSKELETAL: No joint pain or arthritis.   NEUROLOGIC: No tingling, numbness, weakness.  PSYCHIATRY: No anxiety or depression.   MEDICATIONS AT HOME:   Prior to Admission medications   Medication Sig Start Date End Date Taking? Authorizing Provider  alendronate (FOSAMAX) 70 MG tablet Take 70 mg by mouth once a week.  12/05/15  Yes [provider]  aspirin EC 81 MG tablet Take 81 mg by mouth daily.   Yes [provider]  bisoprolol-hydrochlorothiazide (ZIAC) 5-6.25 MG tablet Take 1 tablet by mouth daily. 03/02/17  Yes [provider]  Cholecalciferol (VITAMIN D3) 5000 UNITS TABS Take 1 tablet by mouth daily.    Yes [provider]  vitamin B-12 (CYANOCOBALAMIN) 1000 MCG tablet Take 1,000 mcg by mouth daily.   Yes [provider]  ibuprofen (ADVIL,MOTRIN) 200 MG tablet Take 200 mg by mouth every 6 (six) hours as needed  for fever, headache or mild pain.    [provider]  traMADol (ULTRAM) 50 MG tablet Take 50 mg by mouth every 6 (six) hours as needed.     [provider]  triamcinolone cream (KENALOG) 0.1 % Apply 1 application topically 2 (two) times daily.  11/09/15   [provider]      VITAL SIGNS:  Blood pressure (!) 159/69, pulse 75, temperature 98.6 F (37 C), temperature source Oral, resp. rate 16, weight 47.2 kg (104 lb), SpO2 95 %.  PHYSICAL EXAMINATION:  GENERAL:  82 y.o.-year-old patient lying in the bed with no acute distress.  EYES: Pupils equal, round, reactive to light and accommodation. No scleral icterus. Extraocular  muscles intact.  HEENT: Head atraumatic, normocephalic. Oropharynx and nasopharynx clear.  NECK:  Supple, no jugular venous distention. No thyroid enlargement, no tenderness.  LUNGS: Patient has no wheezing. CARDIOVASCULAR: S1, S2 normal. No murmurs, rubs, or gallops.  ABDOMEN: Soft, nontender, nondistended. Bowel sounds present. No organomegaly or mass.  EXTREMITIES: No pedal edema, cyanosis, or clubbing.  NEUROLOGIC: Cranial nerves II through XII are intact. Muscle strength 5/5 in all extremities. Sensation intact. Gait not checked.  PSYCHIATRIC: The patient is alert and oriented x 3.  SKIN: No obvious rash, lesion, or ulcer.   LABORATORY PANEL:   CBC Recent Labs  Lab 03/30/17 1131  WBC 12.9*  HGB 13.5  HCT 39.5  PLT 282   ------------------------------------------------------------------------------------------------------------------  Chemistries  Recent Labs  Lab 03/30/17 1131  NA 132*  K 4.4  CL 97*  CO2 27  GLUCOSE 150*  BUN 13  CREATININE 0.62  CALCIUM 8.7*   ------------------------------------------------------------------------------------------------------------------  Cardiac Enzymes No results for input(s): TROPONINI in the last 168 hours. ------------------------------------------------------------------------------------------------------------------  RADIOLOGY:  Dg Lumbar Spine 2-3 Views  Result Date: 03/30/2017 CLINICAL DATA:  Low back pain after fall. EXAM: LUMBAR SPINE - 2-3 VIEW COMPARISON:  PET-CT dated November 07, 2014. FINDINGS: Thoracolumbar scoliosis. Sagittal alignment is maintained. No acute fracture or subluxation. Vertebral body heights are preserved. Moderate disc height loss at L1-L2 and L2-L3 with prominent anterior endplate osteophytes. Osteopenia. Aortoiliac atherosclerotic vascular disease. IMPRESSION: No acute osseous abnormality.  Stable lumbar degenerative changes. Electronically Signed   By: Titus Dubin M.D.   On: 03/30/2017  08:34   Ct Chest Wo Contrast  Result Date: 03/30/2017 CLINICAL DATA:  Left-sided rib and back pain after fall. EXAM: CT CHEST WITHOUT CONTRAST TECHNIQUE: Multidetector CT imaging of the chest was performed following the standard protocol without IV contrast. COMPARISON:  CT chest dated July 13, 2016. FINDINGS: Cardiovascular: No significant vascular findings. Normal heart size. No pericardial effusion. Normal caliber thoracic aorta. Coronary, aortic arch, and branch vessel atherosclerotic vascular disease. Mediastinum/Nodes: No enlarged mediastinal or axillary lymph nodes. Thyroid gland, trachea, and esophagus demonstrate no significant findings. Lungs/Pleura: Unchanged irregular left upper lobe nodule measuring 9 mm. Stable surrounding radiation changes. Unchanged 4 mm nodule in the lingula (series 3, image 83). Unchanged 3 mm subpleural nodule in the right lower lobe (series 3, image 116). No new pulmonary nodule. No focal consolidation, pleural effusion, or pneumothorax. Upper Abdomen: No acute abnormality. Slight interval increase in size of a 9 mm hyperdense lesion arising from the posterior upper pole of the left kidney, previously 7 mm. Musculoskeletal: Acute, minimally displaced fractures of the left posterior ninth, tenth, and eleventh ribs. Acute, minimally displaced fracture of the left T10 and T11 transverse processes. Unchanged T9 compression deformity. No new vertebral body height loss. IMPRESSION: 1. Acute, minimally  displaced fractures of the left posterior ninth through eleventh ribs. 2. Acute, essentially nondisplaced fractures of the left T10 and T11 transverse processes. 3. Unchanged 9 mm left upper lobe nodule with stable surrounding radiation changes. 4. Slight interval increase in size of a 9 mm hyperdense, indeterminate lesion in the left kidney. Further evaluation with nonemergent contrast-enhanced MRI is recommended. 5.  Aortic atherosclerosis (ICD10-I70.0). Electronically Signed   By:  Titus Dubin M.D.   On: 03/30/2017 08:25    EKG:   Orders placed or performed during the hospital encounter of 03/30/17  . EKG 12-Lead  . EKG 12-Lead  . ED EKG  . ED EKG  . EKG 12-Lead  . EKG 12-Lead    IMPRESSION AND PLAN:   82 year old female patient with history of COPD, ongoing tobacco abuse, history of previous lung cancer status post treatment had a fall at home and found to have multiple rib fractures on the left side.    #1. minimally displaced fractures of the left ninth lateral 11th ribs causing hypoxia with O2 sats 87% on room air.  Continue pain medicines, incentive spirometry, oxygen, patient likely need oxygen at home and advised the patient to quit smoking. And also has acute nondisplaced fractures of the left at T10, T11 transverse processes.  So pain control and incentive spirometry.  Get physical therapy evaluation, ambulatory oxygen saturation level.  #2. history of hypertension: Controlled, continue home medicine bisoprolol, HCTZ. Discussed with daughter.  All the records are reviewed and case discussed with ED provider. Management plans discussed with the patient, family and they are in agreement.  CODE STATUS: Full code  TOTAL TIME TAKING CARE OF THIS PATIENT: 55 minutes.    Epifanio Lesches M.D on 03/30/2017 at 6:06 PM  Between 7am to 6pm - Pager - [phone]  After 6pm go to [email or web address] - password [email or web address] [facility]  Office  [phone]  CC: Primary care physician; Idelle Crouch, MD  Note: This dictation was prepared with Dragon dictation along with smaller phrase technology. Any transcriptional errors that result from this process are unintentional.

## 2017-03-30 NOTE — ED Notes (Signed)
This RN attempted to call report. Left on hold for 6 minutes and 24 seconds. Will attempt to call back

## 2017-03-30 NOTE — ED Notes (Addendum)
Patient transported to X-ray & CT °

## 2017-03-30 NOTE — ED Provider Notes (Signed)
[facility] Emergency Department Provider Note  ____________________________________________  Time seen: Approximately 7:52 AM  I have reviewed the triage vital signs and the nursing notes.   HISTORY  Chief Complaint Fall    HPI Krystal Greene is a 82 y.o. female reports a trip and fall in her bathroom this morning causing her to fall on the left side onto her toilet. She complains of pain in the left posterior thorax. Denies shortness of breath. Denies head trauma or neck pain. No paresthesias or weakness. No preceding symptoms that contributed to the fall. Pain is worse with laying back and change in position, no alleviating factors. Rated as moderate to severe and achy. Nonradiating.  Patient was ambulatory on scene with EMS.  Patient is a long-time smoker. Does not use oxygen at home. Reports she is at baseline as far as her chronic "cigarette cough" and exercise tolerance.   Past Medical History:  Diagnosis Date  . GERD (gastroesophageal reflux disease)   . Lung cancer ([place])   . Osteoporosis      There are no active problems to display for this patient.    Past Surgical History:  Procedure Laterality Date  . BACK SURGERY    . BREAST EXCISIONAL BIOPSY Right 30+ yrs ago?   neg  . CERVICAL FUSION    . ESOPHAGEAL DILATION       Prior to Admission medications   Medication Sig Start Date End Date Taking? Authorizing Provider  alendronate (FOSAMAX) 70 MG tablet Take 70 mg by mouth once a week.  12/05/15  Yes [provider]  aspirin EC 81 MG tablet Take 81 mg by mouth daily.   Yes [provider]  bisoprolol-hydrochlorothiazide (ZIAC) 5-6.25 MG tablet Take 1 tablet by mouth daily. 03/02/17  Yes [provider]  Cholecalciferol (VITAMIN D3) 5000 UNITS TABS Take 1 tablet by mouth daily.    Yes [provider]  vitamin B-12 (CYANOCOBALAMIN) 1000 MCG tablet Take 1,000 mcg by mouth daily.   Yes [provider]  ibuprofen (ADVIL,MOTRIN) 200 MG tablet Take 200 mg by mouth every 6 (six) hours as needed for fever, headache or mild pain.    [provider]  traMADol (ULTRAM) 50 MG tablet Take 50 mg by mouth every 6 (six) hours as needed.     [provider]  triamcinolone cream (KENALOG) 0.1 % Apply 1 application topically 2 (two) times daily.  11/09/15   [provider]     Allergies Hydrocodone   History reviewed. No pertinent family history.  Social History Social History   Tobacco Use  . Smoking status: Current Every Day Smoker    Packs/day: 1.00    Types: Cigarettes  . Smokeless tobacco: Never Used  Substance Use Topics  . Alcohol use: Yes    Alcohol/week: 0.6 oz    Types: 1 Glasses of wine per week    Comment: nightly  . Drug use: No    Review of Systems  Constitutional:   No fever or chills.  ENT:   No sore throat. No rhinorrhea. Cardiovascular:   No chest pain or syncope. Respiratory:   No dyspnea , positive chronic cough. Gastrointestinal:   Negative for abdominal pain, vomiting and diarrhea.  Musculoskeletal:   Positive left posterior thorax pain in the area of the ribs and paraspinous lumbar musculature All other systems reviewed and are negative except as documented above in ROS and HPI.  ____________________________________________   PHYSICAL EXAM:  VITAL SIGNS: ED  Triage Vitals  Enc Vitals Group     BP 03/30/17 0706 (!) 154/95     Pulse Rate 03/30/17 0706 95     Resp 03/30/17 0706 18     Temp 03/30/17 0706 98.2 F (36.8 C)     Temp Source 03/30/17 0706 Oral     SpO2 03/30/17 0706 90 %     Weight 03/30/17 0704 104 lb (47.2 kg)     Height --      Head Circumference --      Peak Flow --      Pain Score 03/30/17 0704 6     Pain Loc --      Pain Edu? --      Excl. in [place]? --     Vital signs reviewed, nursing assessments reviewed.   Constitutional:   Alert and oriented. Well appearing and in no distress. Eyes:    No scleral icterus.  EOMI. No nystagmus. No conjunctival pallor. PERRL. ENT   Head:   Normocephalic and atraumatic.   Nose:   No congestion/rhinnorhea.    Mouth/Throat:   MMM, no pharyngeal erythema. No peritonsillar mass.    Neck:   No meningismus. Full ROM. No midline spinal tenderness Hematological/Lymphatic/Immunilogical:   No cervical lymphadenopathy. Cardiovascular:   RRR. Symmetric bilateral radial and DP pulses.  No murmurs.  Respiratory:   Normal respiratory effort without tachypnea/retractions. Breath sounds are clear and equal bilaterally. No wheezes/rales/rhonchi. Gastrointestinal:   Soft and nontender. Non distended. There is no CVA tenderness.  No rebound, rigidity, or guarding. Genitourinary:   deferred Musculoskeletal:   Normal range of motion in all extremities. No joint effusions.  No lower extremity tenderness.  No edema. Pelvis stable. Tenderness of left posterior inferior ribs.  There is also tenderness in the left paraspinous lumbar back. No midline spinal tenderness of the C, T or L-spine Neurologic:   Normal speech and language.  Motor grossly intact. No acute focal neurologic deficits are appreciated.  Skin:    Skin is warm, dry and intact. No rash noted.  No petechiae, purpura, or bullae.  ____________________________________________    LABS (pertinent positives/negatives) (all labs ordered are listed, but only abnormal results are displayed) Labs Reviewed  BASIC METABOLIC PANEL - Abnormal; Notable for the following components:      Result Value   Sodium 132 (*)    Chloride 97 (*)    Glucose, Bld 150 (*)    Calcium 8.7 (*)    All other components within normal limits  CBC WITH DIFFERENTIAL/PLATELET - Abnormal; Notable for the following components:   WBC 12.9 (*)    Neutro Abs 11.5 (*)    All other components within normal limits   ____________________________________________   EKG  Interpreted by me Sinus rhythm rate of 89, normal axis.  Normal intervals. Poor R-wave progression in anterior precordial leads. LVH. No acute ischemic changes.  ____________________________________________    RADIOLOGY  Dg Lumbar Spine 2-3 Views  Result Date: 03/30/2017 CLINICAL DATA:  Low back pain after fall. EXAM: LUMBAR SPINE - 2-3 VIEW COMPARISON:  PET-CT dated November 07, 2014. FINDINGS: Thoracolumbar scoliosis. Sagittal alignment is maintained. No acute fracture or subluxation. Vertebral body heights are preserved. Moderate disc height loss at L1-L2 and L2-L3 with prominent anterior endplate osteophytes. Osteopenia. Aortoiliac atherosclerotic vascular disease. IMPRESSION: No acute osseous abnormality.  Stable lumbar degenerative changes. Electronically Signed   By: Titus Dubin M.D.   On: 03/30/2017 08:34   Ct Chest Wo Contrast  Result Date: 03/30/2017  CLINICAL DATA:  Left-sided rib and back pain after fall. EXAM: CT CHEST WITHOUT CONTRAST TECHNIQUE: Multidetector CT imaging of the chest was performed following the standard protocol without IV contrast. COMPARISON:  CT chest dated July 13, 2016. FINDINGS: Cardiovascular: No significant vascular findings. Normal heart size. No pericardial effusion. Normal caliber thoracic aorta. Coronary, aortic arch, and branch vessel atherosclerotic vascular disease. Mediastinum/Nodes: No enlarged mediastinal or axillary lymph nodes. Thyroid gland, trachea, and esophagus demonstrate no significant findings. Lungs/Pleura: Unchanged irregular left upper lobe nodule measuring 9 mm. Stable surrounding radiation changes. Unchanged 4 mm nodule in the lingula (series 3, image 83). Unchanged 3 mm subpleural nodule in the right lower lobe (series 3, image 116). No new pulmonary nodule. No focal consolidation, pleural effusion, or pneumothorax. Upper Abdomen: No acute abnormality. Slight interval increase in size of a 9 mm hyperdense lesion arising from the posterior upper pole of the left kidney, previously 7 mm.  Musculoskeletal: Acute, minimally displaced fractures of the left posterior ninth, tenth, and eleventh ribs. Acute, minimally displaced fracture of the left T10 and T11 transverse processes. Unchanged T9 compression deformity. No new vertebral body height loss. IMPRESSION: 1. Acute, minimally displaced fractures of the left posterior ninth through eleventh ribs. 2. Acute, essentially nondisplaced fractures of the left T10 and T11 transverse processes. 3. Unchanged 9 mm left upper lobe nodule with stable surrounding radiation changes. 4. Slight interval increase in size of a 9 mm hyperdense, indeterminate lesion in the left kidney. Further evaluation with nonemergent contrast-enhanced MRI is recommended. 5.  Aortic atherosclerosis (ICD10-I70.0). Electronically Signed   By: Titus Dubin M.D.   On: 03/30/2017 08:25    ____________________________________________   PROCEDURES Procedures  ____________________________________________    CLINICAL IMPRESSION / ASSESSMENT AND PLAN / ED COURSE  Pertinent labs & imaging results that were available during my care of the patient were reviewed by me and considered in my medical decision making (see chart for details).   Patient well-appearing no acute distress, unremarkable vital signs. Oxygen level is 90% on room air, but I suspect this is chronic for her as she has no respiratory complaints and is a chronic smoker at 82 years of age. Presentation is concerning for rib fracture. Due to age of obtaining a CT scan of the chest without contrast for further evaluation. Also x-ray of the lumbar spine. If there are no severe findings, she may be suitable for discharge home with pain control and primary care follow-up.  Considering the patient's symptoms, medical history, and physical examination today, I have low suspicion for ACS, PE, TAD, pneumothorax, carditis, mediastinitis, pneumonia, CHF, or sepsis.    Clinical Course as of Mar 30 1208  Thu Mar 30, 2017  0838 CT reveals minimally displaced inferior rib fractures. No hemothorax or pneumothorax. Associated transverse process fractures. Management and pain control. Primary care notes reveal the patient normally takes NSAIDs for chronic back pain, will continue NSAIDs as well as Lidoderm patch as the safest choice for her pain control. Reassess pain and respiratory status  [PS]  1105 Pain is improved while in bed, but severe when upright. Patient had desaturation to 87% when standing up.   Review electronic medical records shows no mention of chronic respiratory failure and hypoxia to suggest that her low oxygen saturations are her baseline. With presumably acute respiratory failure with hypoxia in the setting of acute rib fractures, advanced age, underlying COPD,  and shallow inspirations, patient's at high risk for worsening hypoxia, pneumonia, poor outcome. I will  discuss with hospitalist for supplemental oxygen, respiratory monitoring, PT OT and incentive spirometry.  [PS]    Clinical Course User Index [PS] Carrie Mew, MD     ____________________________________________   FINAL CLINICAL IMPRESSION(S) / ED DIAGNOSES    Final diagnoses:  Closed fracture of multiple ribs of left side, initial encounter  Closed fracture of transverse process of thoracic vertebra, initial encounter [facility])  Acute respiratory failure with hypoxia [facility])     ED Discharge Orders    None      Portions of this note were generated with dragon dictation software. Dictation errors may occur despite best attempts at proofreading.    Carrie Mew, MD 03/30/17 1210

## 2017-03-31 DIAGNOSIS — Z79899 Other long term (current) drug therapy: Secondary | ICD-10-CM | POA: Diagnosis not present

## 2017-03-31 DIAGNOSIS — F17210 Nicotine dependence, cigarettes, uncomplicated: Secondary | ICD-10-CM | POA: Diagnosis present

## 2017-03-31 DIAGNOSIS — Y92002 Bathroom of unspecified non-institutional (private) residence single-family (private) house as the place of occurrence of the external cause: Secondary | ICD-10-CM | POA: Diagnosis not present

## 2017-03-31 DIAGNOSIS — W010XXA Fall on same level from slipping, tripping and stumbling without subsequent striking against object, initial encounter: Secondary | ICD-10-CM | POA: Diagnosis present

## 2017-03-31 DIAGNOSIS — K5900 Constipation, unspecified: Secondary | ICD-10-CM | POA: Diagnosis present

## 2017-03-31 DIAGNOSIS — Z7983 Long term (current) use of bisphosphonates: Secondary | ICD-10-CM | POA: Diagnosis not present

## 2017-03-31 DIAGNOSIS — Z85118 Personal history of other malignant neoplasm of bronchus and lung: Secondary | ICD-10-CM | POA: Diagnosis not present

## 2017-03-31 DIAGNOSIS — J449 Chronic obstructive pulmonary disease, unspecified: Secondary | ICD-10-CM | POA: Diagnosis present

## 2017-03-31 DIAGNOSIS — S2242XA Multiple fractures of ribs, left side, initial encounter for closed fracture: Secondary | ICD-10-CM | POA: Diagnosis present

## 2017-03-31 DIAGNOSIS — Z885 Allergy status to narcotic agent status: Secondary | ICD-10-CM | POA: Diagnosis not present

## 2017-03-31 DIAGNOSIS — J9601 Acute respiratory failure with hypoxia: Secondary | ICD-10-CM | POA: Diagnosis present

## 2017-03-31 DIAGNOSIS — M810 Age-related osteoporosis without current pathological fracture: Secondary | ICD-10-CM | POA: Diagnosis present

## 2017-03-31 DIAGNOSIS — Z7982 Long term (current) use of aspirin: Secondary | ICD-10-CM | POA: Diagnosis not present

## 2017-03-31 DIAGNOSIS — I10 Essential (primary) hypertension: Secondary | ICD-10-CM | POA: Diagnosis present

## 2017-03-31 DIAGNOSIS — R079 Chest pain, unspecified: Secondary | ICD-10-CM | POA: Diagnosis present

## 2017-03-31 DIAGNOSIS — K219 Gastro-esophageal reflux disease without esophagitis: Secondary | ICD-10-CM | POA: Diagnosis present

## 2017-03-31 DIAGNOSIS — E871 Hypo-osmolality and hyponatremia: Secondary | ICD-10-CM | POA: Diagnosis present

## 2017-03-31 LAB — CBC
HEMATOCRIT: 40.8 % (ref 35.0–47.0)
Hemoglobin: 13.8 g/dL (ref 12.0–16.0)
MCH: 30.2 pg (ref 26.0–34.0)
MCHC: 33.7 g/dL (ref 32.0–36.0)
MCV: 89.7 fL (ref 80.0–100.0)
PLATELETS: 284 10*3/uL (ref 150–440)
RBC: 4.55 MIL/uL (ref 3.80–5.20)
RDW: 13.2 % (ref 11.5–14.5)
WBC: 8.8 10*3/uL (ref 3.6–11.0)

## 2017-03-31 LAB — BASIC METABOLIC PANEL
Anion gap: 12 (ref 5–15)
BUN: 12 mg/dL (ref 6–20)
CHLORIDE: 97 mmol/L — AB (ref 101–111)
CO2: 24 mmol/L (ref 22–32)
CREATININE: 0.59 mg/dL (ref 0.44–1.00)
Calcium: 8.9 mg/dL (ref 8.9–10.3)
GFR calc Af Amer: >60 mL/min (ref >60)
GFR calc non Af Amer: >60 mL/min (ref >60)
GLUCOSE: 154 mg/dL — AB (ref 65–99)
POTASSIUM: 4 mmol/L (ref 3.5–5.1)
SODIUM: 133 mmol/L — AB (ref 135–145)

## 2017-03-31 LAB — GLUCOSE, CAPILLARY: Glucose-Capillary: 154 mg/dL — ABNORMAL HIGH (ref 65–99)

## 2017-03-31 MED ORDER — SENNOSIDES-DOCUSATE SODIUM 8.6-50 MG PO TABS
2.0000 | ORAL_TABLET | Freq: Two times a day (BID) | ORAL | Status: DC
  Administered 2017-03-31 – 2017-04-01 (×2): 2 via ORAL
  Filled 2017-03-31 (×2): qty 2

## 2017-03-31 MED ORDER — IPRATROPIUM-ALBUTEROL 0.5-2.5 (3) MG/3ML IN SOLN
3.0000 mL | RESPIRATORY_TRACT | Status: DC | PRN
Start: 2017-03-31 — End: 2017-04-01

## 2017-03-31 MED ORDER — KETOROLAC TROMETHAMINE 30 MG/ML IJ SOLN
15.0000 mg | Freq: Four times a day (QID) | INTRAMUSCULAR | Status: DC | PRN
  Administered 2017-03-31 – 2017-04-01 (×4): 15 mg via INTRAVENOUS
  Filled 2017-03-31 (×4): qty 1

## 2017-03-31 NOTE — Care Management Note (Addendum)
Case Management Note  Patient Details  Name: Krystal Greene MRN: [id number] Date of Birth: Jul 21, 1935  Subjective/Objective:   Admitted to [facility] with the diagnosis of acute respiratory failure. Lives alone.Daughter is Krystal Greene [phone]). Son is Krystal Greene [phone]).  Last seen Dr. Doy Hutching about a year ago. Prescriptions are filled at  [facility] on [address]. No home Health. No skilled Nursing.. No home oxygen. No medical equipment in the home. Takes care of all basic activities of daily living herself, drives. [place] [address] prior to this admission. Decreased appetite, no weight loss. Lung Cancer 2 years ago. Lost weight at that time. Received radiation at that time. Family will transport                 Action/Plan: Requested information on Med Alert - given   Expected Discharge Date:                  Expected Discharge Plan:     In-House Referral:   yes  Discharge planning Services   yes  Post Acute Care Choice:    Choice offered to:     DME Arranged:    DME Agency:     HH Arranged:    HH Agency:     Status of Service:     If discussed at [employer], dates discussed:    Additional Comments:  Shelbie Ammons, RN MSN CCM Care Management [phone] 03/31/2017, 12:00 PM

## 2017-03-31 NOTE — Plan of Care (Signed)
  Education: Knowledge of General Education information will improve 03/31/2017 0959 - Progressing by Herbie Baltimore, RN   Health Behavior/Discharge Planning: Ability to manage health-related needs will improve 03/31/2017 0959 - Progressing by Herbie Baltimore, RN   Clinical Measurements: Ability to maintain clinical measurements within normal limits will improve 03/31/2017 0959 - Progressing by Herbie Baltimore, RN Will remain free from infection 03/31/2017 0959 - Progressing by Herbie Baltimore, RN Diagnostic test results will improve 03/31/2017 0959 - Progressing by Herbie Baltimore, RN Respiratory complications will improve 03/31/2017 0959 - Progressing by Herbie Baltimore, RN Cardiovascular complication will be avoided 03/31/2017 0959 - Progressing by Herbie Baltimore, RN   Pain Managment: General experience of comfort will improve 03/31/2017 0959 - Progressing by Herbie Baltimore, RN

## 2017-03-31 NOTE — Progress Notes (Signed)
[place] [facility] [place] NAME: Krystal Greene    MR#:  [id number]  DATE OF BIRTH:  07-10-1935  SUBJECTIVE:  CHIEF COMPLAINT:   Chief Complaint  Patient presents with  . Fall  in pain, son and daughter at bedside worried about pneumonia REVIEW OF SYSTEMS:  Review of Systems  Constitutional: Positive for malaise/fatigue and weight loss. Negative for chills and fever.  HENT: Negative for nosebleeds and sore throat.   Eyes: Negative for blurred vision.  Respiratory: Negative for cough, shortness of breath and wheezing.   Cardiovascular: Positive for chest pain. Negative for orthopnea, leg swelling and PND.  Gastrointestinal: Negative for abdominal pain, constipation, diarrhea, heartburn, nausea and vomiting.  Genitourinary: Negative for dysuria and urgency.  Musculoskeletal: Negative for back pain.  Skin: Negative for rash.  Neurological: Positive for weakness. Negative for dizziness, speech change, focal weakness and headaches.  Endo/Heme/Allergies: Does not bruise/bleed easily.  Psychiatric/Behavioral: Negative for depression.   DRUG ALLERGIES:   Allergies  Allergen Reactions  . Hydrocodone Other (See Comments)    Syncope    VITALS:  Blood pressure (!) 147/66, pulse 61, temperature 98.4 F (36.9 C), temperature source Oral, resp. rate 20, weight 48.1 kg (106 lb 1.6 oz), SpO2 98 %. PHYSICAL EXAMINATION:  Physical Exam  Constitutional: She is oriented to person, place, and time and well-developed, well-nourished, and in no distress.  HENT:  Head: Normocephalic and atraumatic.  Eyes: Conjunctivae and EOM are normal. Pupils are equal, round, and reactive to light.  Neck: Normal range of motion. Neck supple. No tracheal deviation present. No thyromegaly present.  Cardiovascular: Normal rate, regular rhythm and normal heart sounds.  Pulmonary/Chest: Effort normal and breath sounds normal. No respiratory distress. She has no wheezes. She  exhibits tenderness and bony tenderness.  Abdominal: Soft. Bowel sounds are normal. She exhibits no distension. There is no tenderness.  Musculoskeletal: Normal range of motion.  Neurological: She is alert and oriented to person, place, and time. No cranial nerve deficit.  Skin: Skin is warm and dry. No rash noted.  Psychiatric: Mood and affect normal.   LABORATORY PANEL:  Female CBC Recent Labs  Lab 03/31/17 0540  WBC 8.8  HGB 13.8  HCT 40.8  PLT 284   ------------------------------------------------------------------------------------------------------------------ Chemistries  Recent Labs  Lab 03/31/17 0540  NA 133*  K 4.0  CL 97*  CO2 24  GLUCOSE 154*  BUN 12  CREATININE 0.59  CALCIUM 8.9   RADIOLOGY:  No results found. ASSESSMENT AND PLAN:  82 year old female patient with history of COPD, ongoing tobacco abuse, history of previous lung cancer status post treatment had a fall at home and found to have multiple rib fractures on the left side.    #1. minimally displaced fractures of the left ninth lateral 11th ribs causing hypoxia with O2 sats 87% on room air.  Continue pain medicines, incentive spirometry, oxygen, patient likely need oxygen at home and advised the patient to quit smoking. And also has acute nondisplaced fractures of the left at T10, T11 transverse processes.  So pain control and incentive spirometry.    #2. history of hypertension: Controlled, continue home medicine bisoprolol, HCTZ.  # 3 Constipation: bowel regimen started  #4 Hyponatremia: IVF and monitor      All the records are reviewed and case discussed with Care Management/Social Worker. Management plans discussed with the patient, family (son & daughter at bedside) and they are in agreement.  CODE STATUS: Full Code  TOTAL  TIME TAKING CARE OF THIS PATIENT: 25 minutes.   More than 50% of the time was spent in counseling/coordination of care: YES  POSSIBLE D/C IN 1-2 DAYS, DEPENDING ON  CLINICAL CONDITION.   Max Sane M.D on 03/31/2017 at 6:17 PM  Between 7am to 6pm - Pager - [phone]  After 6pm go to [email or web address] - Proofreader  [employer] [facility]  Office  [phone]  CC: Primary care physician; Idelle Crouch, MD  Note: This dictation was prepared with Dragon dictation along with smaller phrase technology. Any transcriptional errors that result from this process are unintentional.

## 2017-04-01 MED ORDER — BISACODYL 5 MG PO TBEC: 5.0000 mg | DELAYED_RELEASE_TABLET | Freq: Every day | ORAL | 0 refills | Status: DC | PRN

## 2017-04-01 MED ORDER — TRAMADOL HCL 50 MG PO TABS: 50.0000 mg | ORAL_TABLET | Freq: Four times a day (QID) | ORAL | 0 refills | Status: DC | PRN

## 2017-04-01 MED ORDER — SENNOSIDES-DOCUSATE SODIUM 8.6-50 MG PO TABS: 2.0000 | ORAL_TABLET | Freq: Two times a day (BID) | ORAL | 0 refills | Status: DC

## 2017-04-01 MED ORDER — LIDOCAINE 5 % EX PTCH: 1.0000 | MEDICATED_PATCH | CUTANEOUS | 0 refills | Status: DC

## 2017-04-01 NOTE — Progress Notes (Signed)
Patient discharged with family. IV removed, catheter in tact. Patient and family verbalized understanding of education. Patient with no complaints.

## 2017-04-01 NOTE — Progress Notes (Signed)
Family Meeting Note  Advance Directive:yes  Today a meeting took place with the Patient and son & daughter at bedside.   The following clinical team members were present during this meeting:MD  The following were discussed:Patient's diagnosis:   82 year old female patient with history of COPD, ongoing tobacco abuse, history of previous lung cancer status post treatment had a fall at home and found to have multiple rib fractures on the left side.   * minimally displaced fractures of the left ninth lateral 11th ribs causing hypoxia with O2 sats 87% on room air.   Patient's progosis: > 12 months and Goals for treatment: Full Code  Additional follow-up to be provided: Ongoing discussion on code status and living will.  Time spent during discussion:20 minutes  Krystal Sane, MD

## 2017-04-01 NOTE — Discharge Summary (Addendum)
[facility] [place] NAME: Krystal Greene    MR#:  [id number]  DATE OF BIRTH:  25-Sep-1935  DATE OF ADMISSION:  03/30/2017 ADMITTING PHYSICIAN: Epifanio Lesches, MD  DATE OF DISCHARGE: 04/01/2017  PRIMARY CARE PHYSICIAN: Idelle Crouch, MD    ADMISSION DIAGNOSIS:  Acute respiratory failure with hypoxia ([place]) [J96.01] Closed fracture of transverse process of thoracic vertebra, initial encounter ([place]) [S22.009A] Closed fracture of multiple ribs of left side, initial encounter [S22.42XA]  DISCHARGE DIAGNOSIS:  Active Problems:   Acute respiratory failure with hypoxemia (HCC)   Acute respiratory failure with hypoxia ([place])   SECONDARY DIAGNOSIS:   Past Medical History:  Diagnosis Date  . GERD (gastroesophageal reflux disease)   . Lung cancer ([place])   . Osteoporosis     HOSPITAL COURSE:   82 year old female patient with history of COPD, ongoing tobacco abuse, history of previous lung cancer status post treatment had a fall at home and found to have multiple rib fractures on the left side.   #1.minimally displaced fractures of the left ninth lateral 11th ribs causing hypoxia with O2 sats 87% on room air. Continue pain medicines, incentive spirometry, And also has acute nondisplaced fractures of the left at T10, T11 transverse processes. So pain control and incentive spirometry.   she was able to walk a whole circle around nurses station without oxygen and without any support.  #2.history of hypertension: Controlled, continue home medicine bisoprolol, HCTZ.  # 3 Constipation: bowel regimen started  #4 Hyponatremia: IVF and monitor   Improved.   DISCHARGE CONDITIONS:   Stable.  CONSULTS OBTAINED:    DRUG ALLERGIES:   Allergies  Allergen Reactions  . Hydrocodone Other (See Comments)    Syncope     DISCHARGE MEDICATIONS:   Allergies as of 04/01/2017      Reactions   Hydrocodone Other (See Comments)   Syncope       Medication List    TAKE these medications   alendronate 70 MG tablet Commonly known as:  FOSAMAX Take 70 mg by mouth once a week.   aspirin EC 81 MG tablet Take 81 mg by mouth daily.   bisacodyl 5 MG EC tablet Commonly known as:  DULCOLAX Take 1 tablet (5 mg total) by mouth daily as needed for moderate constipation.   bisoprolol-hydrochlorothiazide 5-6.25 MG tablet Commonly known as:  ZIAC Take 1 tablet by mouth daily.   ibuprofen 200 MG tablet Commonly known as:  ADVIL,MOTRIN Take 200 mg by mouth every 6 (six) hours as needed for fever, headache or mild pain.   lidocaine 5 % Commonly known as:  LIDODERM Place 1 patch onto the skin daily. Remove & Discard patch within 12 hours or as directed by MD Start taking on:  04/02/2017   senna-docusate 8.6-50 MG tablet Commonly known as:  Senokot-S Take 2 tablets by mouth 2 (two) times daily.   traMADol 50 MG tablet Commonly known as:  ULTRAM Take 1 tablet (50 mg total) by mouth every 6 (six) hours as needed for moderate pain or severe pain. What changed:  reasons to take this   triamcinolone cream 0.1 % Commonly known as:  KENALOG Apply 1 application topically 2 (two) times daily.   vitamin B-12 1000 MCG tablet Commonly known as:  CYANOCOBALAMIN Take 1,000 mcg by mouth daily.   Vitamin D3 5000 units Tabs Take 1 tablet by mouth daily.        DISCHARGE INSTRUCTIONS:    Follow  with PMD in 1-2 weeks.  If you experience worsening of your admission symptoms, develop shortness of breath, life threatening emergency, suicidal or homicidal thoughts you must seek medical attention immediately by calling 911 or calling your MD immediately  if symptoms less severe.  You Must read complete instructions/literature along with all the possible adverse reactions/side effects for all the Medicines you take and that have been prescribed to you. Take any new Medicines after you have completely understood and accept all the  possible adverse reactions/side effects.   Please note  You were cared for by a hospitalist during your hospital stay. If you have any questions about your discharge medications or the care you received while you were in the hospital after you are discharged, you can call the unit and asked to speak with the hospitalist on call if the hospitalist that took care of you is not available. Once you are discharged, your primary care physician will handle any further medical issues. Please note that NO REFILLS for any discharge medications will be authorized once you are discharged, as it is imperative that you return to your primary care physician (or establish a relationship with a primary care physician if you do not have one) for your aftercare needs so that they can reassess your need for medications and monitor your lab values.    Today   CHIEF COMPLAINT:   Chief Complaint  Patient presents with  . Fall    HISTORY OF PRESENT ILLNESS:  Krystal Greene  is a 82 y.o. female with a known history of lung cancer who lives alone had  mechanical fall last night fell on her left side.  Patient went to bed and this morning she had severe pain on left side of the chest, patient found to have 3 rib fractures on the left side but no pneumothorax.  Patient desaturated to 87% on room air when she stood up.  She has history of COPD, continues to smoke about 1 pack of cigarettes a day.  She denies any other complaints.  He says that having pain in the left side of the chest only when she moves if she does not move she does not have pain.   VITAL SIGNS:  Blood pressure 128/62, pulse 68, temperature 98.5 F (36.9 C), temperature source Oral, resp. rate 16, weight 46.7 kg (103 lb), SpO2 93 %.  I/O:    Intake/Output Summary (Last 24 hours) at 04/01/2017 1319 Last data filed at 03/31/2017 1834 Gross per 24 hour  Intake 360 ml  Output -  Net 360 ml    PHYSICAL EXAMINATION:  Constitutional: She is oriented  to person, place, and time and well-developed, well-nourished, and in no distress.  HENT:  Head: Normocephalic and atraumatic.  Eyes: Conjunctivae and EOM are normal. Pupils are equal, round, and reactive to light.  Neck: Normal range of motion. Neck supple. No tracheal deviation present. No thyromegaly present.  Cardiovascular: Normal rate, regular rhythm and normal heart sounds.  Pulmonary/Chest: Effort normal and breath sounds normal. No respiratory distress. She has no wheezes. She exhibits tenderness and bony tenderness.  Abdominal: Soft. Bowel sounds are normal. She exhibits no distension. There is no tenderness.  Musculoskeletal: Normal range of motion.  Neurological: She is alert and oriented to person, place, and time. No cranial nerve deficit.  Skin: Skin is warm and dry. No rash noted.  Psychiatric: Mood and affect normal.      DATA REVIEW:   CBC Recent Labs  Lab 03/31/17  0540  WBC 8.8  HGB 13.8  HCT 40.8  PLT 284    Chemistries  Recent Labs  Lab 03/31/17 0540  NA 133*  K 4.0  CL 97*  CO2 24  GLUCOSE 154*  BUN 12  CREATININE 0.59  CALCIUM 8.9    Cardiac Enzymes No results for input(s): TROPONINI in the last 168 hours.  Microbiology Results  No results found for this or any previous visit.  RADIOLOGY:  No results found.  EKG:   Orders placed or performed during the hospital encounter of 03/30/17  . EKG 12-Lead  . EKG 12-Lead  . ED EKG  . ED EKG  . EKG 12-Lead  . EKG 12-Lead  . EKG      Management plans discussed with the patient, family and they are in agreement.  CODE STATUS:     Code Status Orders  (From admission, onward)        Start     Ordered   03/30/17 1221  Full code  Continuous     03/30/17 1222    Code Status History    Date Active Date Inactive Code Status Order ID Comments User Context   This patient has a current code status but no historical code status.      TOTAL TIME TAKING CARE OF THIS PATIENT: 35  minutes.    Vaughan Basta M.D on 04/01/2017 at 1:19 PM  Between 7am to 6pm - Pager - [phone]  After 6pm go to [email or web address] - password [email or web address] [facility]  Office  [phone]  CC: Primary care physician; Idelle Crouch, MD   Note: This dictation was prepared with Dragon dictation along with smaller phrase technology. Any transcriptional errors that result from this process are unintentional.

## 2017-04-19 ENCOUNTER — Other Ambulatory Visit: Payer: Self-pay | Admitting: Internal Medicine

## 2017-04-19 DIAGNOSIS — R413 Other amnesia: Secondary | ICD-10-CM

## 2017-05-03 ENCOUNTER — Ambulatory Visit
Admission: RE | Admit: 2017-05-03 | Discharge: 2017-05-03 | Disposition: A | Discharge status: Home / Self Care | Payer: Medicare Other | Source: Ambulatory Visit | Attending: Internal Medicine | Admitting: Internal Medicine

## 2017-05-03 DIAGNOSIS — I739 Peripheral vascular disease, unspecified: Secondary | ICD-10-CM | POA: Diagnosis not present

## 2017-05-03 DIAGNOSIS — R413 Other amnesia: Secondary | ICD-10-CM | POA: Insufficient documentation

## 2017-05-03 DIAGNOSIS — I672 Cerebral atherosclerosis: Secondary | ICD-10-CM | POA: Diagnosis not present

## 2017-07-12 ENCOUNTER — Other Ambulatory Visit: Payer: Self-pay

## 2017-07-12 ENCOUNTER — Ambulatory Visit
Admission: RE | Admit: 2017-07-12 | Discharge: 2017-07-12 | Disposition: A | Discharge status: Home / Self Care | Payer: Medicare Other | Source: Ambulatory Visit | Attending: Radiation Oncology | Admitting: Radiation Oncology

## 2017-07-12 ENCOUNTER — Other Ambulatory Visit: Payer: Self-pay | Admitting: *Deleted

## 2017-07-12 ENCOUNTER — Encounter: Payer: Self-pay | Admitting: Radiation Oncology

## 2017-07-12 VITALS — BP 161/79 | HR 77 | Temp 97.0°F | Wt 101.6 lb

## 2017-07-12 DIAGNOSIS — C3412 Malignant neoplasm of upper lobe, left bronchus or lung: Secondary | ICD-10-CM | POA: Insufficient documentation

## 2017-07-12 DIAGNOSIS — Z923 Personal history of irradiation: Secondary | ICD-10-CM | POA: Diagnosis not present

## 2017-07-12 NOTE — Progress Notes (Signed)
Radiation Oncology Follow up Note  Name: Krystal Greene   Date:   07/12/2017 MRN:  [id number] DOB: 01-19-1936    This 82 y.o. female presents to the clinic today for 2.5 year follow-up status post SB RT to her left upper lobe for stage I adenocarcinoma.  REFERRING PROVIDER: Idelle Crouch, MD  HPI: patient is an 82 year old female now out 2.5 years having completed SB RT to her left upper lobe for stage I adenocarcinoma. Seen today in routine follow-up she is doing well. She specifically denies cough hemoptysis or chest tightness..patient had a CT scan back in March 2019 showing unchanged 9 mm left upper lobe nodulestable surrounding radiation changes. She does have a slight interval increase in 9 mm hyperdense lesion the left kidney which we will continue to observe.  COMPLICATIONS OF TREATMENT: none  FOLLOW UP COMPLIANCE: keeps appointments   PHYSICAL EXAM:  BP (!) 161/79   Pulse 77   Temp (!) 97 F (36.1 C)   Wt 101 lb 10.1 oz (46.1 kg)   BMI 18.00 kg/m  Well-developed well-nourished patient in NAD. HEENT reveals PERLA, EOMI, discs not visualized.  Oral cavity is clear. No oral mucosal lesions are identified. Neck is clear without evidence of cervical or supraclavicular adenopathy. Lungs are clear to A&P. Cardiac examination is essentially unremarkable with regular rate and rhythm without murmur rub or thrill. Abdomen is benign with no organomegaly or masses noted. Motor sensory and DTR levels are equal and symmetric in the upper and lower extremities. Cranial nerves II through XII are grossly intact. Proprioception is intact. No peripheral adenopathy or edema is identified. No motor or sensory levels are noted. Crude visual fields are within normal range.  RADIOLOGY RESULTS: CT scans reviewed and compatible above-stated findings  PLAN: present time patient is doing well CT scan confirms excellent response to SB RT. I'm please were overall progress. I've asked to see her back in 1  year with a CT scan prior. Should this renal mass continue to enlarge will make referral to urology. Patient is to call with any concerns at any time.  I would like to take this opportunity to thank you for allowing me to participate in the care of your patient.Noreene Filbert, MD

## 2017-09-15 IMAGING — PT NM PET TUM IMG INITIAL (PI) WHOLE BODY
1 of 10 series · 3 of 25 positions shown · non-contrast
Comparison: 10/29/2014 chest CT.

CLINICAL DATA: Initial treatment strategy for enlarging apical left
upper lobe ground-glass pulmonary nodule.

EXAM:
NUCLEAR MEDICINE PET SKULL BASE TO THIGH
TECHNIQUE: 12.35 mCi F-18 FDG was injected intravenously. Full-ring PET imaging
was performed from the skull base to thigh after the radiotracer. CT
data was obtained and used for attenuation correction and anatomic
localization.
FASTING BLOOD GLUCOSE:  Value: 114 mg/dl

[Series 3: ct wb 5.0 b30f · axial · 5.0mm · 0.98mm/px · z∈[-1358,-491]mm · 3 of 290 slices shown]
[im 1/290  soft-tissue]
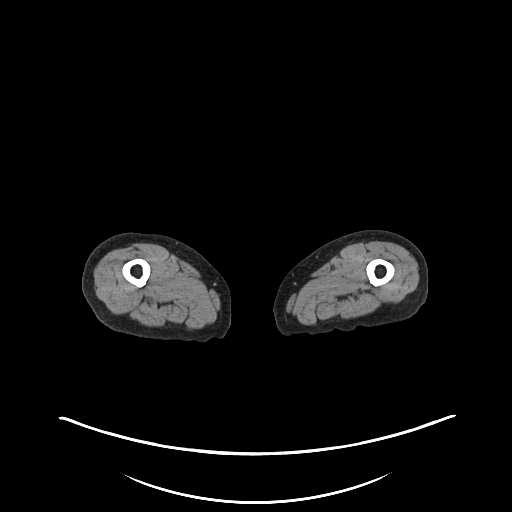
[im 145/290  soft-tissue]
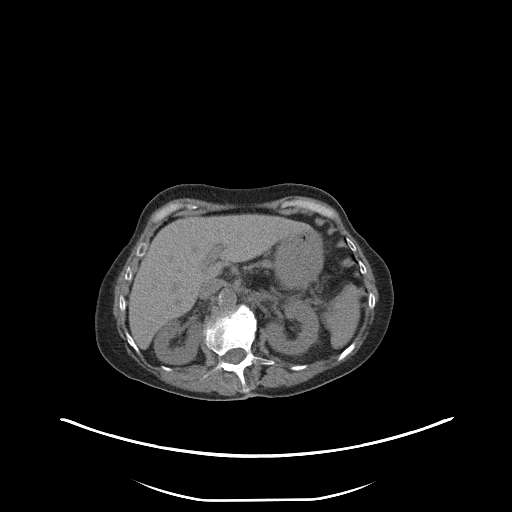
[im 290/290  soft-tissue]
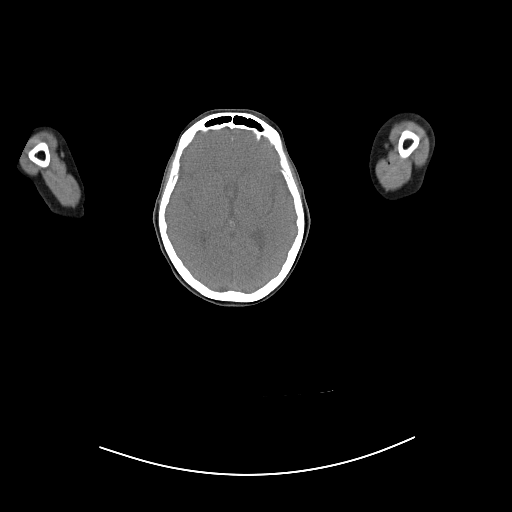

[3 of 25 positions shown; findings below may reference images not displayed]

FINDINGS: NECK

No hypermetabolic lymph nodes in the neck. There are scattered foci
of paraspinal brown fat uptake along the cervical spine. Non
hypermetabolic 0.8 cm hypodense left thyroid lobe nodule.
Subcentimeter calcifications in the lower right thyroid lobe.

CHEST

No hypermetabolic axillary, mediastinal or hilar nodes. There is
atherosclerosis of the thoracic aorta, the great vessels of the
mediastinum and the coronary arteries, including calcified
atherosclerotic plaque in the left main, left anterior descending,
left circumflex and right coronary arteries. There is a 2.7 x 2.0 cm
ground-glass pulmonary nodule in the apical left upper lobe with low
level metabolism (max SUV 2.1), which is mildly increased in size
since 07/01/2014 chest CT where it measured 2.3 x 1.8 cm using
similar measurement technique. Stable 0.4 cm subpleural solid left
upper lobe pulmonary nodule (3/62) and stable 4 mm ground-glass left
upper lobe pulmonary nodule (3/75), which are below PET resolution.
No acute consolidative airspace disease or new significant pulmonary
nodules. New subsegmental atelectasis in the medial lingula.

There are scattered foci of paraspinal brown fat uptake along the
thoracic spine. There are scattered foci of brown fat uptake in the
upper mediastinum.

ABDOMEN/PELVIS

No abnormal hypermetabolic activity within the liver, pancreas,
adrenal glands, or spleen. No hypermetabolic lymph nodes in the
abdomen or pelvis. There is a stable 5 mm calcification in the porta
hepatis, which could be located within the cystic duct or upper
common bile duct. No biliary ductal dilatation. Simple 3.2 cm
exophytic renal cyst in the posterior interpolar left kidney.
Atherosclerotic nonaneurysmal abdominal aorta. Mild sigmoid
diverticulosis. Non hypermetabolic 2.4 cm right adnexal simple
appearing cystic structure.

There are scattered foci of brown fat uptake in the upper
retroperitoneum.

SKELETON

No focal hypermetabolic activity to suggest skeletal metastasis.
IMPRESSION: 1. Ground-glass 2.7 x 2.0 cm apical left upper lobe pulmonary nodule
with low level metabolism (max SUV 2.1), which remains suspicious
for a lung adenocarcinoma given the mild growth since 07/01/2014.
Thoracic surgical consultation is advised.
2. No hypermetabolic thoracic adenopathy or distant metastatic
disease.
3. Four month stability of two additional subcentimeter left upper
lobe pulmonary nodules, which are below PET resolution.
4. Atherosclerosis, including left main and 3 vessel coronary artery
disease. Please note that although the presence of coronary artery
calcium documents the presence of coronary artery disease, the
severity of this disease and any potential stenosis cannot be
assessed on this non-gated CT examination.
5. Stable indeterminate 5 mm calcification in the porta hepatis,
cannot exclude a cystic duct or upper common bile duct stone. No
biliary ductal dilatation. If clinically warranted, consider further
evaluation with MRI abdomen with MRCP.

## 2018-07-06 ENCOUNTER — Ambulatory Visit: Payer: Medicare Other

## 2018-07-20 ENCOUNTER — Ambulatory Visit: Payer: Medicare Other | Admitting: Radiation Oncology

## 2018-10-09 ENCOUNTER — Other Ambulatory Visit: Payer: Self-pay

## 2018-10-09 ENCOUNTER — Other Ambulatory Visit: Payer: Self-pay | Admitting: Physician Assistant

## 2018-10-09 ENCOUNTER — Ambulatory Visit
Admission: RE | Admit: 2018-10-09 | Discharge: 2018-10-09 | Disposition: A | Discharge status: Home / Self Care | Payer: Medicare Other | Source: Ambulatory Visit | Attending: Physician Assistant | Admitting: Physician Assistant

## 2018-10-09 DIAGNOSIS — M79605 Pain in left leg: Secondary | ICD-10-CM | POA: Diagnosis not present

## 2019-10-28 ENCOUNTER — Ambulatory Visit: Payer: Medicare Other | Attending: Internal Medicine

## 2019-10-28 DIAGNOSIS — Z23 Encounter for immunization: Secondary | ICD-10-CM

## 2019-10-28 NOTE — Progress Notes (Signed)
   Covid-19 Vaccination Clinic  Name:  Krystal Greene    MRN: [id number] DOB: January 13, 1936  10/28/2019  [name] was observed post Covid-19 immunization for 15 minutes without incident. She was provided with Vaccine Information Sheet and instruction to access the V-Safe system.   [name] was instructed to call 911 with any severe reactions post vaccine: Marland Kitchen Difficulty breathing  . Swelling of face and throat  . A fast heartbeat  . A bad rash all over body  . Dizziness and weakness

## 2020-08-05 ENCOUNTER — Emergency Department
Admission: EM | Admit: 2020-08-05 | Discharge: 2020-08-05 | Disposition: A | Discharge status: Home / Self Care | Payer: Medicare Other | Attending: Emergency Medicine | Admitting: Emergency Medicine

## 2020-08-05 ENCOUNTER — Emergency Department: Payer: Medicare Other

## 2020-08-05 ENCOUNTER — Other Ambulatory Visit: Payer: Self-pay

## 2020-08-05 DIAGNOSIS — Z79899 Other long term (current) drug therapy: Secondary | ICD-10-CM | POA: Insufficient documentation

## 2020-08-05 DIAGNOSIS — Z7982 Long term (current) use of aspirin: Secondary | ICD-10-CM | POA: Insufficient documentation

## 2020-08-05 DIAGNOSIS — B9689 Other specified bacterial agents as the cause of diseases classified elsewhere: Secondary | ICD-10-CM | POA: Diagnosis not present

## 2020-08-05 DIAGNOSIS — N390 Urinary tract infection, site not specified: Secondary | ICD-10-CM

## 2020-08-05 DIAGNOSIS — F17210 Nicotine dependence, cigarettes, uncomplicated: Secondary | ICD-10-CM | POA: Diagnosis not present

## 2020-08-05 DIAGNOSIS — R0781 Pleurodynia: Secondary | ICD-10-CM | POA: Diagnosis present

## 2020-08-05 LAB — URINALYSIS, COMPLETE (UACMP) WITH MICROSCOPIC
Bacteria, UA: NONE SEEN
Bilirubin Urine: NEGATIVE
Glucose, UA: NEGATIVE mg/dL
Hgb urine dipstick: NEGATIVE
Ketones, ur: NEGATIVE mg/dL
Nitrite: NEGATIVE
Protein, ur: 100 mg/dL — AB
Specific Gravity, Urine: 1.014 (ref 1.005–1.030)
pH: 6 (ref 5.0–8.0)

## 2020-08-05 MED ORDER — CEFDINIR 300 MG PO CAPS: 300.0000 mg | ORAL_CAPSULE | Freq: Two times a day (BID) | ORAL | 0 refills | Status: DC

## 2020-08-05 NOTE — Discharge Instructions (Addendum)
Follow-up with your regular doctor if not improving in 2 to 3 days.  Return emergency department worsening.  If you develop a rash in the area, call your family doctor to be evaluated for shingles.

## 2020-08-05 NOTE — ED Triage Notes (Signed)
Pt come with c/o left sided rib pain. Pt states no recent injuries. Pt states when she woke up yesterday it was hurting. Pt also states some redness to forehead and possible allergic reaction to something.  Pt denies any CP

## 2020-08-05 NOTE — ED Provider Notes (Signed)
[facility] Emergency Department Provider Note  ____________________________________________   Event Date/Time   First MD Initiated Contact with Patient 08/05/20 1134     (approximate)  I have reviewed the triage vital signs and the nursing notes.   HISTORY  Chief Complaint Rib Injury    HPI Krystal Greene is a 85 y.o. female presents emergency department with her son.  Patient is complaining of left rib pain.  Son states she has been complaining about it for about 2 days.  Patient states it mostly hurts when she moves and turns.  She denies shortness of breath or cardiac type chest pain.  Patient does have a history of osteoporosis and lung cancer.  Past Medical History:  Diagnosis Date   GERD (gastroesophageal reflux disease)    Lung cancer ([place])    Osteoporosis     Patient Active Problem List   Diagnosis Date Noted   Acute respiratory failure with hypoxia ([place]) 03/31/2017   Acute respiratory failure with hypoxemia ([place]) 03/30/2017    Past Surgical History:  Procedure Laterality Date   BACK SURGERY     BREAST EXCISIONAL BIOPSY Right 30+ yrs ago?   neg   CERVICAL FUSION     ESOPHAGEAL DILATION      Prior to Admission medications   Medication Sig Start Date End Date Taking? Authorizing Provider  cefdinir (OMNICEF) 300 MG capsule Take 1 capsule (300 mg total) by mouth 2 (two) times daily. 08/05/20  Yes Gyneth Hubka, Linden Dolin, PA-C  alendronate (FOSAMAX) 70 MG tablet Take 70 mg by mouth once a week.  12/05/15   [provider]  aspirin EC 81 MG tablet Take 81 mg by mouth daily.    [provider]  bisacodyl (DULCOLAX) 5 MG EC tablet Take 1 tablet (5 mg total) by mouth daily as needed for moderate constipation. 04/01/17   Vaughan Basta, MD  bisoprolol-hydrochlorothiazide (ZIAC) 5-6.25 MG tablet Take 1 tablet by mouth daily. 03/02/17   [provider]  Cholecalciferol (VITAMIN D3) 5000 UNITS TABS Take 1 tablet by mouth  daily.     [provider]  donepezil (ARICEPT) 5 MG tablet TAKE 1 TABLET BY MOUTH EVERY DAY AT NIGHT 05/05/17   [provider]  ibuprofen (ADVIL,MOTRIN) 200 MG tablet Take 200 mg by mouth every 6 (six) hours as needed for fever, headache or mild pain.    [provider]  lidocaine (LIDODERM) 5 % Place 1 patch onto the skin daily. Remove & Discard patch within 12 hours or as directed by MD Patient not taking: Reported on 07/12/2017 04/02/17   Vaughan Basta, MD  omeprazole (PRILOSEC) 40 MG capsule Take by mouth daily. 06/25/17   [provider]  senna-docusate (SENOKOT-S) 8.6-50 MG tablet Take 2 tablets by mouth 2 (two) times daily. Patient not taking: Reported on 07/12/2017 04/01/17   Vaughan Basta, MD  traMADol (ULTRAM) 50 MG tablet Take 1 tablet (50 mg total) by mouth every 6 (six) hours as needed for moderate pain or severe pain. Patient not taking: Reported on 07/12/2017 04/01/17   Vaughan Basta, MD  triamcinolone cream (KENALOG) 0.1 % Apply 1 application topically 2 (two) times daily.  11/09/15   [provider]  vitamin B-12 (CYANOCOBALAMIN) 1000 MCG tablet Take 1,000 mcg by mouth daily.    [provider]    Allergies Hydrocodone  No family history on file.  Social History Social History   Tobacco Use   Smoking status: Every Day  Packs/day: 1.00    Pack years: 0.00    Types: Cigarettes   Smokeless tobacco: Never  Substance Use Topics   Alcohol use: Yes    Alcohol/week: 1.0 standard drink    Types: 1 Glasses of wine per week    Comment: nightly   Drug use: No    Review of Systems  Constitutional: No fever/chills Eyes: No visual changes. ENT: No sore throat. Respiratory: Denies cough Cardiovascular: Denies chest pain Gastrointestinal: Denies abdominal pain Genitourinary: Negative for dysuria. Musculoskeletal: Negative for back pain.  Positive for left rib pain Skin: Negative for  rash. Psychiatric: no mood changes,     ____________________________________________   PHYSICAL EXAM:  VITAL SIGNS: ED Triage Vitals  Enc Vitals Group     BP 08/05/20 1054 (!) 181/84     Pulse Rate 08/05/20 1054 (!) 106     Resp 08/05/20 1054 18     Temp 08/05/20 1054 98 F (36.7 C)     Temp src --      SpO2 08/05/20 1054 96 %     Weight --      Height --      Head Circumference --      Peak Flow --      Pain Score 08/05/20 1053 4     Pain Loc --      Pain Edu? --      Excl. in [place]? --     Constitutional: Alert and oriented. Well appearing and in no acute distress. Eyes: Conjunctivae are normal.  Head: Atraumatic. Nose: No congestion/rhinnorhea. Mouth/Throat: Mucous membranes are moist.   Neck:  supple no lymphadenopathy noted Cardiovascular: Normal rate, regular rhythm. Heart sounds are normal Respiratory: Normal respiratory effort.  No retractions, lungs c t a, left rib is tender to palpation Abd: soft nontender bs normal all 4 quad GU: deferred Musculoskeletal: FROM all extremities, warm and well perfused Neurologic:  Normal speech and language.  Skin:  Skin is warm, dry and intact. No rash noted. Psychiatric: Mood and affect are normal. Speech and behavior are normal.  ____________________________________________   LABS (all labs ordered are listed, but only abnormal results are displayed)  Labs Reviewed  URINALYSIS, COMPLETE (UACMP) WITH MICROSCOPIC - Abnormal; Notable for the following components:      Result Value   Color, Urine YELLOW (*)    APPearance HAZY (*)    Protein, ur 100 (*)    Leukocytes,Ua SMALL (*)    All other components within normal limits  URINE CULTURE   ____________________________________________   ____________________________________________  RADIOLOGY  Left ribs with chest  ____________________________________________   PROCEDURES  Procedure(s) performed:  No  Procedures    ____________________________________________   INITIAL IMPRESSION / ASSESSMENT AND PLAN / ED COURSE  Pertinent labs & imaging results that were available during my care of the patient were reviewed by me and considered in my medical decision making (see chart for details).   Patient is an 85 year old female presents with left rib pain for 2 days.  See HPI.  Physical exam shows patient appears stable  DDx: Left rib fracture, metastatic disease, shingles, kidney infection  X-ray of the left ribs with chest, UA ordered  Ua shows small amount of leuks Xray reviewed by me confirmed by radiology to have old fractures but no acute findings  Discussed all the findings with the patient and her family.  POA is in the room also to discuss the findings.  We will place the patient on antibiotic due to  the small amount of leuks.  I did caution him that shingles could also occur in this area and she does not have a rash at this time it would cause pain.  If she develops a rash they should follow-up with her regular doctor.  Patient was discharged in stable condition in the care of her family.  They are agreeable to the treatment plan.  Krystal Greene was evaluated in Emergency Department on 08/05/2020 for the symptoms described in the history of present illness. She was evaluated in the context of the global COVID-19 pandemic, which necessitated consideration that the patient might be at risk for infection with the SARS-CoV-2 virus that causes COVID-19. Institutional protocols and algorithms that pertain to the evaluation of patients at risk for COVID-19 are in a state of rapid change based on information released by regulatory bodies including the CDC and federal and state organizations. These policies and algorithms were followed during the patient's care in the ED.    As part of my medical decision making, I reviewed the following data within the [place]  History obtained from family, Nursing notes reviewed and incorporated, Labs reviewed , Old chart reviewed, Radiograph reviewed , Notes from prior ED visits, and [place] Controlled Substance Database  ____________________________________________   FINAL CLINICAL IMPRESSION(S) / ED DIAGNOSES  Final diagnoses:  Rib pain on left side  Urinary tract infection without hematuria, site unspecified      NEW MEDICATIONS STARTED DURING THIS VISIT:  New Prescriptions   CEFDINIR (OMNICEF) 300 MG CAPSULE    Take 1 capsule (300 mg total) by mouth 2 (two) times daily.     Note:  This document was prepared using Dragon voice recognition software and may include unintentional dictation errors.    Versie Starks, PA-C 08/05/20 1336    Nena Polio, MD 08/05/20 [phone]

## 2020-08-07 LAB — URINE CULTURE: Culture: <10000 — AB

## 2021-01-16 ENCOUNTER — Emergency Department: Payer: Medicare Other

## 2021-01-16 ENCOUNTER — Emergency Department
Admission: EM | Admit: 2021-01-16 | Discharge: 2021-01-16 | Disposition: A | Discharge status: Home / Self Care | Payer: Medicare Other | Attending: Emergency Medicine | Admitting: Emergency Medicine

## 2021-01-16 ENCOUNTER — Other Ambulatory Visit: Payer: Self-pay

## 2021-01-16 DIAGNOSIS — R2241 Localized swelling, mass and lump, right lower limb: Secondary | ICD-10-CM | POA: Insufficient documentation

## 2021-01-16 DIAGNOSIS — M7989 Other specified soft tissue disorders: Secondary | ICD-10-CM

## 2021-01-16 DIAGNOSIS — L03115 Cellulitis of right lower limb: Secondary | ICD-10-CM | POA: Insufficient documentation

## 2021-01-16 DIAGNOSIS — F17210 Nicotine dependence, cigarettes, uncomplicated: Secondary | ICD-10-CM | POA: Diagnosis not present

## 2021-01-16 DIAGNOSIS — Z7982 Long term (current) use of aspirin: Secondary | ICD-10-CM | POA: Diagnosis not present

## 2021-01-16 DIAGNOSIS — Z85118 Personal history of other malignant neoplasm of bronchus and lung: Secondary | ICD-10-CM | POA: Insufficient documentation

## 2021-01-16 DIAGNOSIS — Z79899 Other long term (current) drug therapy: Secondary | ICD-10-CM | POA: Insufficient documentation

## 2021-01-16 LAB — CBC WITH DIFFERENTIAL/PLATELET
Abs Immature Granulocytes: 0.04 10*3/uL (ref 0.00–0.07)
Basophils Absolute: 0.1 10*3/uL (ref 0.0–0.1)
Basophils Relative: 1 %
Eosinophils Absolute: 0.1 10*3/uL (ref 0.0–0.5)
Eosinophils Relative: 1 %
HCT: 43.8 % (ref 36.0–46.0)
Hemoglobin: 14.5 g/dL (ref 12.0–15.0)
Immature Granulocytes: 0 %
Lymphocytes Relative: 11 %
Lymphs Abs: 1.1 10*3/uL (ref 0.7–4.0)
MCH: 31.3 pg (ref 26.0–34.0)
MCHC: 33.1 g/dL (ref 30.0–36.0)
MCV: 94.4 fL (ref 80.0–100.0)
Monocytes Absolute: 0.5 10*3/uL (ref 0.1–1.0)
Monocytes Relative: 5 %
Neutro Abs: 8 10*3/uL — ABNORMAL HIGH (ref 1.7–7.7)
Neutrophils Relative %: 82 %
Platelets: 317 10*3/uL (ref 150–400)
RBC: 4.64 MIL/uL (ref 3.87–5.11)
RDW: 13.6 % (ref 11.5–15.5)
WBC: 9.8 10*3/uL (ref 4.0–10.5)
nRBC: 0 % (ref 0.0–0.2)

## 2021-01-16 LAB — COMPREHENSIVE METABOLIC PANEL
ALT: 24 U/L (ref 0–44)
AST: 28 U/L (ref 15–41)
Albumin: 3.6 g/dL (ref 3.5–5.0)
Alkaline Phosphatase: 97 U/L (ref 38–126)
Anion gap: 6 (ref 5–15)
BUN: 24 mg/dL — ABNORMAL HIGH (ref 8–23)
CO2: 29 mmol/L (ref 22–32)
Calcium: 8.7 mg/dL — ABNORMAL LOW (ref 8.9–10.3)
Chloride: 97 mmol/L — ABNORMAL LOW (ref 98–111)
Creatinine, Ser: 0.81 mg/dL (ref 0.44–1.00)
GFR, Estimated: >60 mL/min (ref >60)
Glucose, Bld: 151 mg/dL — ABNORMAL HIGH (ref 70–99)
Potassium: 4.4 mmol/L (ref 3.5–5.1)
Sodium: 132 mmol/L — ABNORMAL LOW (ref 135–145)
Total Bilirubin: 0.8 mg/dL (ref 0.3–1.2)
Total Protein: 6.8 g/dL (ref 6.5–8.1)

## 2021-01-16 MED ORDER — CEPHALEXIN 500 MG PO CAPS: 500.0000 mg | ORAL_CAPSULE | Freq: Three times a day (TID) | ORAL | 0 refills | Status: AC

## 2021-01-16 MED ORDER — CEFTRIAXONE SODIUM 1 G IJ SOLR
1.0000 g | Freq: Once | INTRAMUSCULAR | Status: AC
  Administered 2021-01-16: 21:00:00 1 g via INTRAMUSCULAR
  Filled 2021-01-16: qty 10

## 2021-01-16 MED ORDER — LIDOCAINE HCL (PF) 1 % IJ SOLN
5.0000 mL | Freq: Once | INTRAMUSCULAR | Status: AC
  Administered 2021-01-16: 21:00:00 5 mL
  Filled 2021-01-16: qty 5

## 2021-01-16 NOTE — ED Triage Notes (Signed)
Pt states that her L foot is swollen and slightly painful- L foot is noticeably swollen compared to the R- no wounds noted- swelling goes up into the calf- no heat or redness noted to calf

## 2021-01-16 NOTE — Discharge Instructions (Signed)
Please take the Keflex antibiotic 3 times daily for the next 5 days to treat the possibility of cellulitis.  Get up and walk around more.  Try not sitting with your leg flexed up under you as much

## 2021-01-16 NOTE — ED Provider Notes (Signed)
[facility] Emergency Department Provider Note ____________________________________________   Event Date/Time   First MD Initiated Contact with Patient 01/16/21 1949     (approximate)  I have reviewed the triage vital signs and the nursing notes.  HISTORY  Chief Complaint Foot Swelling   HPI Krystal Greene is a 85 y.o. femalewho presents to the ED for evaluation of left foot swelling.  Chart review indicates minimal medical history.  Daughter brings patient to the ED for evaluation of right leg swelling and erythema to her right foot.  Daughter reports that patient lives at home by herself and often just lays around all day smoking cigarettes and drinking alcohol.  Not as active as she used to be.  But quite independent.  Patient reports for couple days now that her right foot and ankle has been more swollen than normal.  Reports erythema to the dorsum of the right foot.  She denies any pain, injuries or falls and reports it just looks ugly compared to normal.  Denies any shortness of breath, abdominal pain and this is never happened before.  She takes no medications daily.  Past Medical History:  Diagnosis Date   GERD (gastroesophageal reflux disease)    Lung cancer ([place])    Osteoporosis     Patient Active Problem List   Diagnosis Date Noted   Acute respiratory failure with hypoxia ([place]) 03/31/2017   Acute respiratory failure with hypoxemia ([place]) 03/30/2017    Past Surgical History:  Procedure Laterality Date   BACK SURGERY     BREAST EXCISIONAL BIOPSY Right 30+ yrs ago?   neg   CERVICAL FUSION     ESOPHAGEAL DILATION      Prior to Admission medications   Medication Sig Start Date End Date Taking? Authorizing Provider  cephALEXin (KEFLEX) 500 MG capsule Take 1 capsule (500 mg total) by mouth 3 (three) times daily for 5 days. 01/16/21 01/21/21 Yes Vladimir Crofts, MD  alendronate (FOSAMAX) 70 MG tablet Take 70 mg by mouth once a week.   12/05/15   [provider]  aspirin EC 81 MG tablet Take 81 mg by mouth daily.    [provider]  bisacodyl (DULCOLAX) 5 MG EC tablet Take 1 tablet (5 mg total) by mouth daily as needed for moderate constipation. 04/01/17   Vaughan Basta, MD  bisoprolol-hydrochlorothiazide (ZIAC) 5-6.25 MG tablet Take 1 tablet by mouth daily. 03/02/17   [provider]  cefdinir (OMNICEF) 300 MG capsule Take 1 capsule (300 mg total) by mouth 2 (two) times daily. 08/05/20   Fisher, Linden Dolin, PA-C  Cholecalciferol (VITAMIN D3) 5000 UNITS TABS Take 1 tablet by mouth daily.     [provider]  donepezil (ARICEPT) 5 MG tablet TAKE 1 TABLET BY MOUTH EVERY DAY AT NIGHT 05/05/17   [provider]  ibuprofen (ADVIL,MOTRIN) 200 MG tablet Take 200 mg by mouth every 6 (six) hours as needed for fever, headache or mild pain.    [provider]  lidocaine (LIDODERM) 5 % Place 1 patch onto the skin daily. Remove & Discard patch within 12 hours or as directed by MD Patient not taking: Reported on 07/12/2017 04/02/17   Vaughan Basta, MD  omeprazole (PRILOSEC) 40 MG capsule Take by mouth daily. 06/25/17   [provider]  senna-docusate (SENOKOT-S) 8.6-50 MG tablet Take 2 tablets by mouth 2 (two) times daily. Patient not taking: Reported on 07/12/2017 04/01/17   Vaughan Basta, MD  traMADol (ULTRAM) 50 MG tablet  Take 1 tablet (50 mg total) by mouth every 6 (six) hours as needed for moderate pain or severe pain. Patient not taking: Reported on 07/12/2017 04/01/17   Vaughan Basta, MD  triamcinolone cream (KENALOG) 0.1 % Apply 1 application topically 2 (two) times daily.  11/09/15   [provider]  vitamin B-12 (CYANOCOBALAMIN) 1000 MCG tablet Take 1,000 mcg by mouth daily.    [provider]    Allergies Hydrocodone  No family history on file.  Social History Social History   Tobacco Use   Smoking status: Every Day     Packs/day: 1.00    Types: Cigarettes   Smokeless tobacco: Never  Substance Use Topics   Alcohol use: Yes    Alcohol/week: 1.0 standard drink    Types: 1 Glasses of wine per week    Comment: nightly   Drug use: No    Review of Systems  Constitutional: No fever/chills Eyes: No visual changes. ENT: No sore throat. Cardiovascular: Denies chest pain. Respiratory: Denies shortness of breath. Gastrointestinal: No abdominal pain.  No nausea, no vomiting.  No diarrhea.  No constipation. Genitourinary: Negative for dysuria. Musculoskeletal: Negative for back pain. Positive atraumatic right lower leg swelling Skin: Negative for rash. Neurological: Negative for headaches, focal weakness or numbness.  ____________________________________________   PHYSICAL EXAM:  VITAL SIGNS: Vitals:   01/16/21 2030 01/16/21 2100  BP: (!) 158/99 (!) 155/85  Pulse: 100 (!) 104  Resp:  16  Temp:  98.2 F (36.8 C)  SpO2: 94% 94%     Constitutional: Alert and oriented. Well appearing and in no acute distress.  Thin.  Pleasant and conversational. Eyes: Conjunctivae are normal. PERRL. EOMI. Head: Atraumatic. Nose: No congestion/rhinnorhea. Mouth/Throat: Mucous membranes are moist.  Oropharynx non-erythematous. Neck: No stridor. No cervical spine tenderness to palpation. Cardiovascular: Normal rate, regular rhythm. Grossly normal heart sounds.  Good peripheral circulation. Respiratory: Normal respiratory effort.  No retractions. Lungs CTAB. Gastrointestinal: Soft , nondistended, nontender to palpation. No CVA tenderness. Musculoskeletal: No lower extremity tenderness.  No joint effusions. No signs of acute trauma. Obvious swelling to her right foot and ankle compared to the left.  Some flat and confluent erythema to the dorsum of the right foot but no induration or fluctuance.  Not significantly warm when compared to the left.  No popliteal masses and no inguinal mass.  No evidence of neurologic or  vascular deficits.  Cap refill is somewhat sluggish, taking 3 to 4 seconds to refill. I do take her blood pressure on his right leg while I am examining her and it is with an ABI greater than 1.0. Neurologic:  Normal speech and language. No gross focal neurologic deficits are appreciated. No gait instability noted. Skin:  Skin is warm, dry and intact. No rash noted. Psychiatric: Mood and affect are normal. Speech and behavior are normal.  ____________________________________________   LABS (all labs ordered are listed, but only abnormal results are displayed)  Labs Reviewed  CBC WITH DIFFERENTIAL/PLATELET - Abnormal; Notable for the following components:      Result Value   Neutro Abs 8.0 (*)    All other components within normal limits  COMPREHENSIVE METABOLIC PANEL - Abnormal; Notable for the following components:   Sodium 132 (*)    Chloride 97 (*)    Glucose, Bld 151 (*)    BUN 24 (*)    Calcium 8.7 (*)    All other components within normal limits   ____________________________________________  12 Lead EKG   ____________________________________________  RADIOLOGY  ED MD interpretation:    Official radiology report(s): US Venous Img Lower Unilateral Right (DVT)  Result Date: 01/16/2021 CLINICAL DATA:  Right lower extremity swelling. EXAM: Right LOWER EXTREMITY VENOUS DOPPLER ULTRASOUND TECHNIQUE: Gray-scale sonography with compression, as well as color and duplex ultrasound, were performed to evaluate the deep venous system(s) from the level of the common femoral vein through the popliteal and proximal calf veins. COMPARISON:  None. FINDINGS: VENOUS Normal compressibility of the common femoral, superficial femoral, and popliteal veins, as well as the visualized calf veins. Visualized portions of profunda femoral vein and great saphenous vein unremarkable. No filling defects to suggest DVT on grayscale or color Doppler imaging. Doppler waveforms show normal direction of  venous flow, normal respiratory plasticity and response to augmentation. Limited views of the contralateral common femoral vein are unremarkable. OTHER Mild diffuse subcutaneous edema of the right calf. There is atherosclerotic calcification of the visualized arteries. Limitations: none IMPRESSION: No evidence of DVT in the right lower extremity. Electronically Signed   By: Anner Crete M.D.   On: 01/16/2021 20:47    ____________________________________________   PROCEDURES and INTERVENTIONS  Procedure(s) performed (including Critical Care):  Procedures  Medications  cefTRIAXone (ROCEPHIN) injection 1 g (1 g Intramuscular Given 01/16/21 2122)  lidocaine (PF) (XYLOCAINE) 1 % injection 5 mL (5 mLs Infiltration Given 01/16/21 2122)    ____________________________________________   MDM / ED COURSE   85 year old female presents to the ED with swelling to her right foot and ankle, possibly due to cellulitis, and amenable to outpatient management.  She looks clinically well without signs of trauma, neurologic or vascular deficits.  Blood work is benign and I see no evidence of systemic illness.  No evidence of DVT on ultrasound.  Cellulitis is a possibility and we will cover for this with few days of cephalosporin.       ____________________________________________   FINAL CLINICAL IMPRESSION(S) / ED DIAGNOSES  Final diagnoses:  Swelling of right foot  Right leg swelling  Cellulitis of right lower extremity     ED Discharge Orders          Ordered    cephALEXin (KEFLEX) 500 MG capsule  3 times daily        01/16/21 2121             Blue Ruggerio   Note:  This document was prepared using Systems analyst and may include unintentional dictation errors.    Vladimir Crofts, MD 01/16/21 [phone]

## 2021-01-21 ENCOUNTER — Other Ambulatory Visit: Payer: Self-pay | Admitting: Internal Medicine

## 2021-01-21 DIAGNOSIS — R918 Other nonspecific abnormal finding of lung field: Secondary | ICD-10-CM

## 2021-02-01 ENCOUNTER — Ambulatory Visit
Admission: RE | Admit: 2021-02-01 | Discharge: 2021-02-01 | Disposition: A | Discharge status: Home / Self Care | Payer: Medicare Other | Source: Ambulatory Visit | Attending: Internal Medicine | Admitting: Internal Medicine

## 2021-02-01 ENCOUNTER — Other Ambulatory Visit: Payer: Self-pay

## 2021-02-01 DIAGNOSIS — R918 Other nonspecific abnormal finding of lung field: Secondary | ICD-10-CM

## 2021-02-01 DIAGNOSIS — A419 Sepsis, unspecified organism: Secondary | ICD-10-CM | POA: Diagnosis not present

## 2021-02-01 DIAGNOSIS — J189 Pneumonia, unspecified organism: Secondary | ICD-10-CM | POA: Diagnosis not present

## 2021-02-03 ENCOUNTER — Other Ambulatory Visit: Payer: Self-pay

## 2021-02-03 ENCOUNTER — Encounter: Payer: Self-pay | Admitting: Emergency Medicine

## 2021-02-03 ENCOUNTER — Emergency Department: Payer: Medicare Other

## 2021-02-03 ENCOUNTER — Inpatient Hospital Stay
Admission: EM | Admit: 2021-02-03 | Discharge: 2021-02-05 | DRG: 871 | Disposition: A | Discharge status: Home Health | Payer: Medicare Other | Attending: Internal Medicine | Admitting: Internal Medicine

## 2021-02-03 ENCOUNTER — Inpatient Hospital Stay: Payer: Medicare Other

## 2021-02-03 DIAGNOSIS — I248 Other forms of acute ischemic heart disease: Secondary | ICD-10-CM | POA: Diagnosis present

## 2021-02-03 DIAGNOSIS — Z7982 Long term (current) use of aspirin: Secondary | ICD-10-CM

## 2021-02-03 DIAGNOSIS — A419 Sepsis, unspecified organism: Secondary | ICD-10-CM | POA: Diagnosis present

## 2021-02-03 DIAGNOSIS — E440 Moderate protein-calorie malnutrition: Secondary | ICD-10-CM | POA: Diagnosis present

## 2021-02-03 DIAGNOSIS — Z7983 Long term (current) use of bisphosphonates: Secondary | ICD-10-CM

## 2021-02-03 DIAGNOSIS — Z79899 Other long term (current) drug therapy: Secondary | ICD-10-CM | POA: Diagnosis not present

## 2021-02-03 DIAGNOSIS — R778 Other specified abnormalities of plasma proteins: Secondary | ICD-10-CM

## 2021-02-03 DIAGNOSIS — J189 Pneumonia, unspecified organism: Secondary | ICD-10-CM | POA: Diagnosis present

## 2021-02-03 DIAGNOSIS — M7989 Other specified soft tissue disorders: Secondary | ICD-10-CM | POA: Diagnosis present

## 2021-02-03 DIAGNOSIS — R7989 Other specified abnormal findings of blood chemistry: Secondary | ICD-10-CM | POA: Diagnosis present

## 2021-02-03 DIAGNOSIS — C3412 Malignant neoplasm of upper lobe, left bronchus or lung: Secondary | ICD-10-CM | POA: Diagnosis not present

## 2021-02-03 DIAGNOSIS — M810 Age-related osteoporosis without current pathological fracture: Secondary | ICD-10-CM | POA: Diagnosis present

## 2021-02-03 DIAGNOSIS — Z85118 Personal history of other malignant neoplasm of bronchus and lung: Secondary | ICD-10-CM

## 2021-02-03 DIAGNOSIS — J439 Emphysema, unspecified: Secondary | ICD-10-CM | POA: Diagnosis present

## 2021-02-03 DIAGNOSIS — Z720 Tobacco use: Secondary | ICD-10-CM | POA: Diagnosis present

## 2021-02-03 DIAGNOSIS — I10 Essential (primary) hypertension: Secondary | ICD-10-CM | POA: Diagnosis present

## 2021-02-03 DIAGNOSIS — R918 Other nonspecific abnormal finding of lung field: Secondary | ICD-10-CM | POA: Diagnosis present

## 2021-02-03 DIAGNOSIS — Z681 Body mass index (BMI) 19 or less, adult: Secondary | ICD-10-CM | POA: Diagnosis not present

## 2021-02-03 DIAGNOSIS — K219 Gastro-esophageal reflux disease without esophagitis: Secondary | ICD-10-CM | POA: Diagnosis present

## 2021-02-03 DIAGNOSIS — Z20822 Contact with and (suspected) exposure to covid-19: Secondary | ICD-10-CM | POA: Diagnosis present

## 2021-02-03 DIAGNOSIS — J90 Pleural effusion, not elsewhere classified: Secondary | ICD-10-CM

## 2021-02-03 DIAGNOSIS — F0390 Unspecified dementia without behavioral disturbance: Secondary | ICD-10-CM | POA: Diagnosis present

## 2021-02-03 DIAGNOSIS — Z66 Do not resuscitate: Secondary | ICD-10-CM | POA: Diagnosis present

## 2021-02-03 DIAGNOSIS — N289 Disorder of kidney and ureter, unspecified: Secondary | ICD-10-CM

## 2021-02-03 DIAGNOSIS — J9601 Acute respiratory failure with hypoxia: Secondary | ICD-10-CM | POA: Diagnosis present

## 2021-02-03 DIAGNOSIS — C3490 Malignant neoplasm of unspecified part of unspecified bronchus or lung: Secondary | ICD-10-CM | POA: Diagnosis present

## 2021-02-03 DIAGNOSIS — Z9889 Other specified postprocedural states: Secondary | ICD-10-CM

## 2021-02-03 DIAGNOSIS — Z923 Personal history of irradiation: Secondary | ICD-10-CM

## 2021-02-03 DIAGNOSIS — Z7951 Long term (current) use of inhaled steroids: Secondary | ICD-10-CM

## 2021-02-03 DIAGNOSIS — F17210 Nicotine dependence, cigarettes, uncomplicated: Secondary | ICD-10-CM | POA: Diagnosis present

## 2021-02-03 LAB — CBC WITH DIFFERENTIAL/PLATELET
Abs Immature Granulocytes: 0.04 10*3/uL (ref 0.00–0.07)
Basophils Absolute: 0.1 10*3/uL (ref 0.0–0.1)
Basophils Relative: 1 %
Eosinophils Absolute: 0.3 10*3/uL (ref 0.0–0.5)
Eosinophils Relative: 2 %
HCT: 44.4 % (ref 36.0–46.0)
Hemoglobin: 14.4 g/dL (ref 12.0–15.0)
Immature Granulocytes: 0 %
Lymphocytes Relative: 10 %
Lymphs Abs: 1.4 10*3/uL (ref 0.7–4.0)
MCH: 30.7 pg (ref 26.0–34.0)
MCHC: 32.4 g/dL (ref 30.0–36.0)
MCV: 94.7 fL (ref 80.0–100.0)
Monocytes Absolute: 1.1 10*3/uL — ABNORMAL HIGH (ref 0.1–1.0)
Monocytes Relative: 8 %
Neutro Abs: 10.7 10*3/uL — ABNORMAL HIGH (ref 1.7–7.7)
Neutrophils Relative %: 79 %
Platelets: 188 10*3/uL (ref 150–400)
RBC: 4.69 MIL/uL (ref 3.87–5.11)
RDW: 13.6 % (ref 11.5–15.5)
WBC: 13.5 10*3/uL — ABNORMAL HIGH (ref 4.0–10.5)
nRBC: 0 % (ref 0.0–0.2)

## 2021-02-03 LAB — COMPREHENSIVE METABOLIC PANEL
ALT: 33 U/L (ref 0–44)
AST: 24 U/L (ref 15–41)
Albumin: 2.9 g/dL — ABNORMAL LOW (ref 3.5–5.0)
Alkaline Phosphatase: 108 U/L (ref 38–126)
Anion gap: 5 (ref 5–15)
BUN: 14 mg/dL (ref 8–23)
CO2: 37 mmol/L — ABNORMAL HIGH (ref 22–32)
Calcium: 8.4 mg/dL — ABNORMAL LOW (ref 8.9–10.3)
Chloride: 94 mmol/L — ABNORMAL LOW (ref 98–111)
Creatinine, Ser: 0.54 mg/dL (ref 0.44–1.00)
GFR, Estimated: >60 mL/min (ref >60)
Glucose, Bld: 144 mg/dL — ABNORMAL HIGH (ref 70–99)
Potassium: 4.5 mmol/L (ref 3.5–5.1)
Sodium: 136 mmol/L (ref 135–145)
Total Bilirubin: 1.2 mg/dL (ref 0.3–1.2)
Total Protein: 5.7 g/dL — ABNORMAL LOW (ref 6.5–8.1)

## 2021-02-03 LAB — PROTIME-INR
INR: 1.1 (ref 0.8–1.2)
Prothrombin Time: 13.8 seconds (ref 11.4–15.2)

## 2021-02-03 LAB — HEMOGLOBIN A1C
Hgb A1c MFr Bld: 6.8 % — ABNORMAL HIGH (ref 4.8–5.6)
Mean Plasma Glucose: 148.46 mg/dL

## 2021-02-03 LAB — RESP PANEL BY RT-PCR (FLU A&B, COVID) ARPGX2
Influenza A by PCR: NEGATIVE
Influenza B by PCR: NEGATIVE
SARS Coronavirus 2 by RT PCR: NEGATIVE

## 2021-02-03 LAB — LACTIC ACID, PLASMA: Lactic Acid, Venous: 0.9 mmol/L (ref 0.5–1.9)

## 2021-02-03 LAB — TROPONIN I (HIGH SENSITIVITY)
Troponin I (High Sensitivity): 141 ng/L (ref <18)
Troponin I (High Sensitivity): 130 ng/L (ref <18)

## 2021-02-03 MED ORDER — SODIUM CHLORIDE 0.9 % IV BOLUS
500.0000 mL | Freq: Once | INTRAVENOUS | Status: AC
  Administered 2021-02-03: 500 mL via INTRAVENOUS

## 2021-02-03 MED ORDER — ASPIRIN EC 81 MG PO TBEC
81.0000 mg | DELAYED_RELEASE_TABLET | Freq: Every day | ORAL | Status: DC
  Administered 2021-02-03 – 2021-02-05 (×3): 81 mg via ORAL
  Filled 2021-02-03 (×3): qty 1

## 2021-02-03 MED ORDER — SODIUM CHLORIDE 0.9 % IV SOLN
2.0000 g | INTRAVENOUS | Status: DC
  Administered 2021-02-04 – 2021-02-05 (×2): 2 g via INTRAVENOUS
  Filled 2021-02-03: qty 2
  Filled 2021-02-03: qty 20

## 2021-02-03 MED ORDER — SODIUM CHLORIDE 0.9 % IV SOLN: 1.0000 g | INTRAVENOUS | Status: DC

## 2021-02-03 MED ORDER — HEPARIN SODIUM (PORCINE) 5000 UNIT/ML IJ SOLN: 5000.0000 [IU] | Freq: Three times a day (TID) | INTRAMUSCULAR | Status: DC

## 2021-02-03 MED ORDER — BISOPROLOL FUMARATE 5 MG PO TABS
5.0000 mg | ORAL_TABLET | Freq: Every day | ORAL | Status: DC
  Administered 2021-02-03 – 2021-02-05 (×3): 5 mg via ORAL
  Filled 2021-02-03 (×3): qty 1

## 2021-02-03 MED ORDER — IPRATROPIUM-ALBUTEROL 0.5-2.5 (3) MG/3ML IN SOLN
3.0000 mL | RESPIRATORY_TRACT | Status: DC
  Administered 2021-02-03 – 2021-02-05 (×11): 3 mL via RESPIRATORY_TRACT
  Filled 2021-02-03 (×12): qty 3

## 2021-02-03 MED ORDER — ENSURE ENLIVE PO LIQD
237.0000 mL | Freq: Two times a day (BID) | ORAL | Status: DC
  Administered 2021-02-03 – 2021-02-05 (×4): 237 mL via ORAL

## 2021-02-03 MED ORDER — ACETAMINOPHEN 325 MG PO TABS: 650.0000 mg | ORAL_TABLET | Freq: Four times a day (QID) | ORAL | Status: DC | PRN

## 2021-02-03 MED ORDER — ONDANSETRON HCL 4 MG/2ML IJ SOLN: 4.0000 mg | Freq: Three times a day (TID) | INTRAMUSCULAR | Status: DC | PRN

## 2021-02-03 MED ORDER — NICOTINE 21 MG/24HR TD PT24
21.0000 mg | MEDICATED_PATCH | Freq: Every day | TRANSDERMAL | Status: DC
  Administered 2021-02-03 – 2021-02-05 (×3): 21 mg via TRANSDERMAL
  Filled 2021-02-03 (×3): qty 1

## 2021-02-03 MED ORDER — HYDRALAZINE HCL 20 MG/ML IJ SOLN: 5.0000 mg | INTRAMUSCULAR | Status: DC | PRN

## 2021-02-03 MED ORDER — DM-GUAIFENESIN ER 30-600 MG PO TB12
1.0000 | ORAL_TABLET | Freq: Two times a day (BID) | ORAL | Status: DC | PRN
  Filled 2021-02-03: qty 1

## 2021-02-03 MED ORDER — SODIUM CHLORIDE 0.9 % IV SOLN: 500.0000 mg | INTRAVENOUS | Status: DC

## 2021-02-03 MED ORDER — SODIUM CHLORIDE 0.9 % IV SOLN
1.0000 g | Freq: Once | INTRAVENOUS | Status: AC
  Administered 2021-02-03: 1 g via INTRAVENOUS
  Filled 2021-02-03: qty 10

## 2021-02-03 MED ORDER — AZITHROMYCIN 500 MG PO TABS
500.0000 mg | ORAL_TABLET | Freq: Every day | ORAL | Status: DC
  Administered 2021-02-04 – 2021-02-05 (×2): 500 mg via ORAL
  Filled 2021-02-03 (×2): qty 1

## 2021-02-03 MED ORDER — CEFTRIAXONE SODIUM 1 G IJ SOLR
1.0000 g | Freq: Once | INTRAMUSCULAR | Status: AC
  Administered 2021-02-03: 1 g via INTRAVENOUS
  Filled 2021-02-03: qty 10

## 2021-02-03 MED ORDER — ENOXAPARIN SODIUM 40 MG/0.4ML IJ SOSY
40.0000 mg | PREFILLED_SYRINGE | INTRAMUSCULAR | Status: DC
  Administered 2021-02-03 – 2021-02-04 (×2): 40 mg via SUBCUTANEOUS
  Filled 2021-02-03 (×2): qty 0.4

## 2021-02-03 MED ORDER — SODIUM CHLORIDE 0.9 % IV SOLN
500.0000 mg | Freq: Once | INTRAVENOUS | Status: AC
  Administered 2021-02-03: 500 mg via INTRAVENOUS
  Filled 2021-02-03: qty 5

## 2021-02-03 MED ORDER — ALBUTEROL SULFATE (2.5 MG/3ML) 0.083% IN NEBU: 2.5000 mg | INHALATION_SOLUTION | RESPIRATORY_TRACT | Status: DC | PRN

## 2021-02-03 NOTE — ED Triage Notes (Signed)
Pt arrived via [facility] from home due to low oxygen saturations over the last 2-3 days. Per EMS, they have been out to the home, "several" times over last 2-3 days due to pts oxygen saturations in the 70-80s. Per family, pt being evaluated for home oxygen use but has yet to be started. Hx/o Emphysema. EMS reports pts daughter stated that "large mass" was found in pts lung last year but pt is unaware of, but daughter knows.   Pt on 2L oxygen at 95-96%.

## 2021-02-03 NOTE — ED Provider Notes (Signed)
[facility] Provider Note    Event Date/Time   First MD Initiated Contact with Patient 02/03/21 [phone]     (approximate)   History   Shortness of Breath   HPI  Krystal Greene is a 86 y.o. female who presents with shortness of breath, fatigue weakness and cough.  Patient has been treated for a pneumonia over the last week with oral antibiotics however oxygen saturations have been declining per daughter.  Also they have noted a mass on the x-ray and she had a CT scan performed yesterday which I reviewed which demonstrates a mass that is concerning for malignancy.  Patient has a history of dementia and they have not discussed this with her yet     Physical Exam   Triage Vital Signs: ED Triage Vitals  Enc Vitals Group     BP 02/03/21 0601 (!) 156/69     Pulse Rate 02/03/21 0601 73     Resp 02/03/21 0601 (!) 22     Temp 02/03/21 0601 98.4 F (36.9 C)     Temp Source 02/03/21 0601 Oral     SpO2 02/03/21 0601 (!) 87 %     Weight 02/03/21 0602 45.4 kg (100 lb)     Height 02/03/21 0602 1.575 m (5\' 2" )     Head Circumference --      Peak Flow --      Pain Score 02/03/21 0558 0     Pain Loc --      Pain Edu? --      Excl. in [place]? --     Most recent vital signs: Vitals:   02/03/21 0601 02/03/21 0606  BP: (!) 156/69   Pulse: 73   Resp: (!) 22   Temp: 98.4 F (36.9 C)   SpO2: (!) 87% 94%     General: Awake, no distress.  CV:  Good peripheral perfusion.  Resp:  Increased respiratory effort with mild tachypnea, bibasilar Rales Abd:  No distention.  Other:  Mild lower extremity edema   ED Results / Procedures / Treatments   Labs (all labs ordered are listed, but only abnormal results are displayed) Labs Reviewed  CBC WITH DIFFERENTIAL/PLATELET - Abnormal; Notable for the following components:      Result Value   WBC 13.5 (*)    Neutro Abs 10.7 (*)    Monocytes Absolute 1.1 (*)    All other components within normal limits  COMPREHENSIVE  METABOLIC PANEL - Abnormal; Notable for the following components:   Chloride 94 (*)    CO2 37 (*)    Glucose, Bld 144 (*)    Calcium 8.4 (*)    Total Protein 5.7 (*)    Albumin 2.9 (*)    All other components within normal limits  TROPONIN I (HIGH SENSITIVITY) - Abnormal; Notable for the following components:   Troponin I (High Sensitivity) 141 (*)    All other components within normal limits  RESP PANEL BY RT-PCR (FLU A&B, COVID) ARPGX2  CULTURE, BLOOD (ROUTINE X 2)  CULTURE, BLOOD (ROUTINE X 2)  LACTIC ACID, PLASMA  LACTIC ACID, PLASMA  TROPONIN I (HIGH SENSITIVITY)     EKG  ED ECG REPORT I, Lavonia Drafts, the attending physician, personally viewed and interpreted this ECG.  Date: 02/03/2021  Rhythm: normal sinus rhythm QRS Axis: Left axis deviation Intervals: normal ST/T Wave abnormalities: Nonspecific changes Narrative Interpretation: no evidence of acute ischemia    RADIOLOGY Chest x-ray reviewed by me, likely pneumonia and  mass noted    PROCEDURES:  Critical Care performed: yes  CRITICAL CARE Performed by: Lavonia Drafts   Total critical care time: 30 minutes  Critical care time was exclusive of separately billable procedures and treating other patients.  Critical care was necessary to treat or prevent imminent or life-threatening deterioration.  Critical care was time spent personally by me on the following activities: development of treatment plan with patient and/or surrogate as well as nursing, discussions with consultants, evaluation of patient's response to treatment, examination of patient, obtaining history from patient or surrogate, ordering and performing treatments and interventions, ordering and review of laboratory studies, ordering and review of radiographic studies, pulse oximetry and re-evaluation of patient's condition.   Procedures   MEDICATIONS ORDERED IN ED: Medications  cefTRIAXone (ROCEPHIN) 1 g in sodium chloride 0.9 % 100 mL  IVPB (has no administration in time range)  azithromycin (ZITHROMAX) 500 mg in sodium chloride 0.9 % 250 mL IVPB (has no administration in time range)     IMPRESSION / MDM / ASSESSMENT AND PLAN / ED COURSE  I reviewed the triage vital signs and the nursing notes.   Patient presents with failed outpatient treatment for pneumonia.  She is hypoxic upon arrival 87%, nasal cannula oxygen started   Lab work demonstrates elevated white blood cell count, CMP demonstrates CO2 of 37, glucose of 144, otherwise reassuring  Troponin noted to be elevated at 141, I suspect this is from increased work of breathing, she does not have any chest pain, EKG is overall  COVID and influenza PCR negative  Reviewed CT scan from yesterday which demonstrates spiculated mass, concerning for lung malignancy, discussed this with daughter who is aware, they have pulmonology follow-up  Patient will be treated with IV Rocephin, IV azithromycin, blood cultures lactic been sent  She will require admission given hypoxic respiratory failure and Multi lobar pneumonia failed outpatient treatment  Have consulted and discussed with the hospitalist        FINAL CLINICAL IMPRESSION(S) / ED DIAGNOSES   Final diagnoses:  Acute respiratory failure with hypoxia ([place])  Community acquired pneumonia of right lung, unspecified part of lung     Rx / DC Orders   ED Discharge Orders     None        Note:  This document was prepared using Dragon voice recognition software and may include unintentional dictation errors.   Lavonia Drafts, MD 02/03/21 [phone]

## 2021-02-03 NOTE — ED Triage Notes (Signed)
EMS brings pt in from home; to triage via w/c with no distress noted; per EMS daughter st noted low O2 sats at home (mid 80's baseline);74-75% on EMS arrival; placed on O2 at 2l/min via [place] to bring to 95%; pt denies any pain or recent illness, denies cough; +smoker; EMS reports daughter informed them privately that PCP found mass on lung and pt does not know

## 2021-02-03 NOTE — Progress Notes (Signed)
PHARMACIST - PHYSICIAN COMMUNICATION  CONCERNING: Antibiotic IV to Oral Route Change Policy  RECOMMENDATION: This patient is receiving azithromycin by the intravenous route.  Based on criteria approved by the Pharmacy and Therapeutics Committee, the antibiotic(s) is/are being converted to the equivalent oral dose form(s).   DESCRIPTION: These criteria include: Patient being treated for a respiratory tract infection, urinary tract infection, cellulitis or clostridium difficile associated diarrhea if on metronidazole The patient is not neutropenic and does not exhibit a GI malabsorption state The patient is eating (either orally or via tube) and/or has been taking other orally administered medications for a least 24 hours The patient is improving clinically and has a Tmax < 100.5  If you have questions about this conversion, please contact the [place]  02/03/21

## 2021-02-03 NOTE — H&P (Signed)
History and Physical    Krystal Greene HMC:[id number] DOB: 02-Oct-1935 DOA: 02/03/2021  Referring MD/NP/PA:   PCP: Idelle Crouch, MD   Patient coming from:  The patient is coming from home.  At baseline, pt is independent for most of ADL.        Chief Complaint: SOB and lower oxygen saturation  HPI: Krystal Greene is a 86 y.o. female with medical history significant of left upper lobe lung cancer (s/p of radiation therapy), hypertension, GERD, dementia, tobacco abuse, who presents with shortness breath, low oxygen saturation.  Per her daughter at bedside, patient was recently treated for pneumonia with antibiotics by her PCP.  Daughter does not remember the antibiotic name, but cefdinir and Levaquin are on her medication list.  Patient states that she continues to have dry cough, shortness of breath, no chest pain, fever or chills.  She had oxygen desaturated 70-80% on room air at home.  She normally is not wearing oxygen.  She has nausea, vomited once earlier, which has resolved.  Currently patient does not have nausea, vomiting, diarrhea or abdominal pain.  No symptoms of UTI.   Of note, pt has chronic right lower leg swelling. She had negative LE doppler for DVT on 01/16/21. She has a history of left upper lobe lung cancer, and received radiation therapy approximately 7 years ago per her daughter. Pt had CT-chest on 02/01/21 which showed left apical mass. Pt has an appointment with pulmonologist, Dr. Lanney Gins on 02/17/21.   CT-chest without contrast on 02/01/21 3.4 x 2.0 cm spiculated left apical mass is noted concerning for malignancy. Further evaluation with PET scan is recommended. These results will be called to the ordering clinician or representative by the Radiologist Assistant, and communication documented in the PACS or zVision Dashboard.   Interval development of sclerotic density involving posterior portion of left third rib which may represent metastatic  involvement from adjacent probable left apical lesion.   1.4 cm exophytic rounded density is seen arising from upper pole of left kidney which is increased in size compared to prior exam. It is uncertain if this represents neoplasm or hyperdense cyst. Ultrasound may be performed for further evaluation.   Right middle lobe subsegmental atelectasis is noted.   Coronary artery calcifications are noted suggesting coronary artery disease.   Aortic Atherosclerosis (ICD10-I70.0).    ED Course: pt was found to have   Review of Systems:   General: no fevers, chills, no body weight gain, has fatigue HEENT: no blurry vision, hearing changes or sore throat Respiratory: has dyspnea, coughing, no wheezing CV: no chest pain, no palpitations GI: no nausea, vomiting, abdominal pain, diarrhea, constipation GU: no dysuria, burning on urination, increased urinary frequency, hematuria  Ext: has right lower leg edema Neuro: no unilateral weakness, numbness, or tingling, no vision change or hearing loss Skin: no rash, no skin tear. MSK: No muscle spasm, no deformity, no limitation of range of movement in spin Heme: No easy bruising.  Travel history: No recent long distant travel.  Allergy:  Allergies  Allergen Reactions   Hydrocodone Other (See Comments)    Syncope     Past Medical History:  Diagnosis Date   GERD (gastroesophageal reflux disease)    Lung cancer ([place])    Osteoporosis     Past Surgical History:  Procedure Laterality Date   BACK SURGERY     BREAST EXCISIONAL BIOPSY Right 30+ yrs ago?   neg   CERVICAL FUSION     ESOPHAGEAL  DILATION      Social History:  reports that she has been smoking cigarettes. She has been smoking an average of 1 pack per day. She has never used smokeless tobacco. She reports current alcohol use of about 1.0 standard drink per week. She reports that she does not use drugs.  Family History:  Family History  Problem Relation Age of Onset   Kidney  disease Sister      Prior to Admission medications   Medication Sig Start Date End Date Taking? Authorizing Provider  ADVAIR DISKUS 250-50 MCG/ACT AEPB Inhale 1 puff into the lungs 2 (two) times daily. 02/01/21   [provider]  alendronate (FOSAMAX) 70 MG tablet Take 70 mg by mouth once a week.  12/05/15   [provider]  aspirin EC 81 MG tablet Take 81 mg by mouth daily.    [provider]  bisacodyl (DULCOLAX) 5 MG EC tablet Take 1 tablet (5 mg total) by mouth daily as needed for moderate constipation. 04/01/17   Vaughan Basta, MD  bisoprolol-hydrochlorothiazide (ZIAC) 5-6.25 MG tablet Take 1 tablet by mouth daily. 03/02/17   [provider]  cefdinir (OMNICEF) 300 MG capsule Take 1 capsule (300 mg total) by mouth 2 (two) times daily. 08/05/20   Fisher, Linden Dolin, PA-C  Cholecalciferol (VITAMIN D3) 5000 UNITS TABS Take 1 tablet by mouth daily.     [provider]  donepezil (ARICEPT) 5 MG tablet TAKE 1 TABLET BY MOUTH EVERY DAY AT NIGHT 05/05/17   [provider]  ibuprofen (ADVIL,MOTRIN) 200 MG tablet Take 200 mg by mouth every 6 (six) hours as needed for fever, headache or mild pain.    [provider]  levofloxacin (LEVAQUIN) 500 MG tablet Take 500 mg by mouth daily. 01/20/21   [provider]  lidocaine (LIDODERM) 5 % Place 1 patch onto the skin daily. Remove & Discard patch within 12 hours or as directed by MD Patient not taking: Reported on 07/12/2017 04/02/17   Vaughan Basta, MD  omeprazole (PRILOSEC) 40 MG capsule Take by mouth daily. 06/25/17   [provider]  predniSONE (DELTASONE) 10 MG tablet Take 1 tablet by mouth See admin instructions. 6 tabs x2 days 4 tabs x2 days 2 tabs x2 days 1 tab x2 days 01/20/21   [provider]  senna-docusate (SENOKOT-S) 8.6-50 MG tablet Take 2 tablets by mouth 2 (two) times daily. Patient not taking: Reported on 07/12/2017 04/01/17   Vaughan Basta,  MD  traMADol (ULTRAM) 50 MG tablet Take 1 tablet (50 mg total) by mouth every 6 (six) hours as needed for moderate pain or severe pain. Patient not taking: Reported on 07/12/2017 04/01/17   Vaughan Basta, MD  triamcinolone cream (KENALOG) 0.1 % Apply 1 application topically 2 (two) times daily.  11/09/15   [provider]  vitamin B-12 (CYANOCOBALAMIN) 1000 MCG tablet Take 1,000 mcg by mouth daily.    [provider]    Physical Exam: Vitals:   02/03/21 0602 02/03/21 0606 02/03/21 1035 02/03/21 1308  BP:   136/76 126/65  Pulse:   74 71  Resp:   18 18  Temp:    97.9 F (36.6 C)  TempSrc:    Oral  SpO2:  94% 99% 99%  Weight: 45.4 kg     Height: 5\' 2"  (1.575 m)      General: Not in acute distress HEENT:       Eyes: PERRL, EOMI, no scleral icterus.  ENT: No discharge from the ears and nose, no pharynx injection, no tonsillar enlargement.        Neck: No JVD, no bruit, no mass felt. Heme: No neck lymph node enlargement. Cardiac: S1/S2, RRR, No murmurs, No gallops or rubs. Respiratory: has decreased air movement on the right side GI: Soft, nondistended, nontender, no rebound pain, no organomegaly, BS present. GU: No hematuria Ext: has 1+ pitting right lower leg edema. 1+DP/PT pulse bilaterally. Musculoskeletal: No joint deformities, No joint redness or warmth, no limitation of ROM in spin. Skin: No rashes.  Neuro: Alert, oriented X3, cranial nerves II-XII grossly intact, moves all extremities normally. Psych: Patient is not psychotic, no suicidal or hemocidal ideation.  Labs on Admission: I have personally reviewed following labs and imaging studies  CBC: Recent Labs  Lab 02/03/21 0607  WBC 13.5*  NEUTROABS 10.7*  HGB 14.4  HCT 44.4  MCV 94.7  PLT 099   Basic Metabolic Panel: Recent Labs  Lab 02/03/21 0607  NA 136  K 4.5  CL 94*  CO2 37*  GLUCOSE 144*  BUN 14  CREATININE 0.54  CALCIUM 8.4*   GFR: Estimated Creatinine Clearance:  37.5 mL/min (by C-G formula based on SCr of 0.54 mg/dL). Liver Function Tests: Recent Labs  Lab 02/03/21 0607  AST 24  ALT 33  ALKPHOS 108  BILITOT 1.2  PROT 5.7*  ALBUMIN 2.9*   No results for input(s): LIPASE, AMYLASE in the last 168 hours. No results for input(s): AMMONIA in the last 168 hours. Coagulation Profile: No results for input(s): INR, PROTIME in the last 168 hours. Cardiac Enzymes: No results for input(s): CKTOTAL, CKMB, CKMBINDEX, TROPONINI in the last 168 hours. BNP (last 3 results) No results for input(s): PROBNP in the last 8760 hours. HbA1C: Recent Labs    02/03/21 0607  HGBA1C 6.8*   CBG: No results for input(s): GLUCAP in the last 168 hours. Lipid Profile: No results for input(s): CHOL, HDL, LDLCALC, TRIG, CHOLHDL, LDLDIRECT in the last 72 hours. Thyroid Function Tests: No results for input(s): TSH, T4TOTAL, FREET4, T3FREE, THYROIDAB in the last 72 hours. Anemia Panel: No results for input(s): VITAMINB12, FOLATE, FERRITIN, TIBC, IRON, RETICCTPCT in the last 72 hours. Urine analysis:    Component Value Date/Time   COLORURINE YELLOW (A) 08/05/2020 1200   APPEARANCEUR HAZY (A) 08/05/2020 1200   LABSPEC 1.014 08/05/2020 1200   PHURINE 6.0 08/05/2020 1200   GLUCOSEU NEGATIVE 08/05/2020 1200   HGBUR NEGATIVE 08/05/2020 1200   BILIRUBINUR NEGATIVE 08/05/2020 1200   KETONESUR NEGATIVE 08/05/2020 1200   PROTEINUR 100 (A) 08/05/2020 1200   NITRITE NEGATIVE 08/05/2020 1200   LEUKOCYTESUR SMALL (A) 08/05/2020 1200   Sepsis Labs: @LABRCNTIP (procalcitonin:4,lacticidven:4) ) Recent Results (from the past 240 hour(s))  Resp Panel by RT-PCR (Flu A&B, Covid) Nasopharyngeal Swab     Status: None   Collection Time: 02/03/21  6:08 AM   Specimen: Nasopharyngeal Swab; Nasopharyngeal(NP) swabs in vial transport medium  Result Value Ref Range Status   SARS Coronavirus 2 by RT PCR NEGATIVE NEGATIVE Final    Comment: (NOTE) SARS-CoV-2 target nucleic acids are NOT  DETECTED.  The SARS-CoV-2 RNA is generally detectable in upper respiratory specimens during the acute phase of infection. The lowest concentration of SARS-CoV-2 viral copies this assay can detect is 138 copies/mL. A negative result does not preclude SARS-Cov-2 infection and should not be used as the sole basis for treatment or other patient management decisions. A negative result may occur with  improper specimen collection/handling,  submission of specimen other than nasopharyngeal swab, presence of viral mutation(s) within the areas targeted by this assay, and inadequate number of viral copies(<138 copies/mL). A negative result must be combined with clinical observations, patient history, and epidemiological information. The expected result is Negative.  Fact Sheet for Patients:  EntrepreneurPulse.com.au  Fact Sheet for Healthcare Providers:  IncredibleEmployment.be  This test is no t yet approved or cleared by the [place] FDA and  has been authorized for detection and/or diagnosis of SARS-CoV-2 by FDA under an Emergency Use Authorization (EUA). This EUA will remain  in effect (meaning this test can be used) for the duration of the COVID-19 declaration under Section 564(b)(1) of the Act, 21 U.S.C.section 360bbb-3(b)(1), unless the authorization is terminated  or revoked sooner.       Influenza A by PCR NEGATIVE NEGATIVE Final   Influenza B by PCR NEGATIVE NEGATIVE Final    Comment: (NOTE) The Xpert Xpress SARS-CoV-2/FLU/RSV plus assay is intended as an aid in the diagnosis of influenza from Nasopharyngeal swab specimens and should not be used as a sole basis for treatment. Nasal washings and aspirates are unacceptable for Xpert Xpress SARS-CoV-2/FLU/RSV testing.  Fact Sheet for Patients: EntrepreneurPulse.com.au  Fact Sheet for Healthcare Providers: IncredibleEmployment.be  This test is not yet  approved or cleared by the [place] FDA and has been authorized for detection and/or diagnosis of SARS-CoV-2 by FDA under an Emergency Use Authorization (EUA). This EUA will remain in effect (meaning this test can be used) for the duration of the COVID-19 declaration under Section 564(b)(1) of the Act, 21 U.S.C. section 360bbb-3(b)(1), unless the authorization is terminated or revoked.  Performed at [facility], [address] [place], [place] [address]      Radiological Exams on Admission: DG Chest 2 View  Result Date: 02/03/2021 CLINICAL DATA:  86 year old female with history of shortness of breath. EXAM: CHEST - 2 VIEW COMPARISON:  Chest x-ray 08/05/2020. FINDINGS: Small bilateral pleural effusions. Opacities in the right middle and lower lobe concerning for multilobar right-sided bronchopneumonia. Left lung appears generally clear, with exception of mass-like area in the apex of the left upper lobe estimated to measure approximately 2.0 x 3.2 cm, better demonstrated on recent chest CT. Mild emphysematous changes are noted. No evidence of pulmonary edema. Heart size is normal. Upper mediastinal contours are within normal limits. Atherosclerotic calcifications are noted in the thoracic aorta. IMPRESSION: 1. Findings are concerning for developing bronchopneumonia in the right middle and lower lobes. 2. Small bilateral pleural effusions. 3. Persistent mass in the apex of the left upper lobe highly concerning for neoplasm. Further evaluation with PET-CT is recommended in the near future to better evaluate this finding. 4. Aortic atherosclerosis. Electronically Signed   By: Vinnie Langton M.D.   On: 02/03/2021 06:33   CT CHEST WO CONTRAST  Result Date: 02/02/2021 CLINICAL DATA:  Lung mass. EXAM: CT CHEST WITHOUT CONTRAST TECHNIQUE: Multidetector CT imaging of the chest was performed following the standard protocol without IV contrast. COMPARISON:  May 30, 2017. FINDINGS:  Cardiovascular: Atherosclerosis of thoracic aorta is noted without aneurysm formation. Mild cardiomegaly is noted. No pericardial effusion is noted. Coronary artery calcifications are noted. Mediastinum/Nodes: No enlarged mediastinal or axillary lymph nodes, although evaluation is limited due to the lack of intravenous contrast. Thyroid gland, trachea, and esophagus demonstrate no significant findings. Lungs/Pleura: No pneumothorax is noted. Moderate right pleural effusion is noted. Mild left pleural effusion is noted. 3.4 x 2.0 cm spiculated left apical mass is  noted concerning for malignancy. Right middle lobe atelectasis is noted. Upper Abdomen: 1.4 cm exophytic rounded abnormality is seen arising from upper pole of left kidney which is increased in size compared to prior exam. Musculoskeletal: There is interval development sclerotic density involving the posterior portion of the left third rib which may represent metastatic involvement from adjacent probable neoplasm. IMPRESSION: 3.4 x 2.0 cm spiculated left apical mass is noted concerning for malignancy. Further evaluation with PET scan is recommended. These results will be called to the ordering clinician or representative by the Radiologist Assistant, and communication documented in the PACS or zVision Dashboard. Interval development of sclerotic density involving posterior portion of left third rib which may represent metastatic involvement from adjacent probable left apical lesion. 1.4 cm exophytic rounded density is seen arising from upper pole of left kidney which is increased in size compared to prior exam. It is uncertain if this represents neoplasm or hyperdense cyst. Ultrasound may be performed for further evaluation. Right middle lobe subsegmental atelectasis is noted. Coronary artery calcifications are noted suggesting coronary artery disease. Aortic Atherosclerosis (ICD10-I70.0). Electronically Signed   By: Marijo Conception M.D.   On: 02/02/2021  08:22     EKG: I have personally reviewed.  Sinus rhythm, QTC 426, LAD, poor R wave progression  Assessment/Plan Principal Problem:   CAP (community acquired pneumonia) Active Problems:   Acute respiratory failure with hypoxia (HCC)   Lung cancer (HCC)   HTN (hypertension)   Tobacco use   Sepsis (HCC)   Elevated troponin   Right leg swelling   Renal lesion   Protein-calorie malnutrition, moderate (HCC)   Sepsis and acute respiratory failure with hypoxia  due to CAP (community acquired pneumonia): Patient has 2 L new oxygen requirement.  Patient meets criteria for sepsis with WBC 13.5 and tachypnea with RR 22.  Lactic acid level is normal 0.9.   - Will admit to med-surg bed as inpt - IV Rocephin and azithromycin - Mucinex for cough  - Bronchodilators - Urine legionella and S. pneumococcal antigen - Follow up blood culture x2, sputum culture - will get Procalcitonin - IVF: 500 mL of NS bolus in ED  Lung cancer: pt has hx of left upper lobe non-small cell lung cancer.  Patient was s/p of radiation therapy approximately 7 years ago.  Patient seems to have recurrent left upper lobe lung cancer.  Per her daughter, patient already had an appointment with Dr. Lanney Gins of pulmonology on 02/17/21.  They would like see Dr. Lanney Gins first, and then decide for next plan.  Daughter states that I do not need to consult oncologist today. -f/u with Dr. Talbert Cage  HTN (hypertension): Patient is taking bisoprolol-HCTZ combined.  (5 mg - 6.25 mg) -switch Ziac to bisoprolol 5 mg daily -prn hydralazine  Tobacco use -Nicotine patch  Elevated troponin: Troponin level 141 --> 130, trending down.  No chest pain.  Most likely due to demand ischemia -Aspirin 81 mg daily -Trend troponin -Check A1c, FLP  Right leg swelling: -will repeat LE doppler to r/o DVT  Renal lesion:  CT of chest on 02/01/2021 incidentally showed a 1.4 cm exophytic rounded density is seen arising from upper pole of left  kidney which is increased in size compared to prior exam -get US-renal  Protein-calorie malnutrition, moderate ([place]) - start Ensure     DVT ppx: SQ Lovenox Code Status: DNR Family Communication:  Yes, patient's daughter   at bed side. Disposition Plan:  Anticipate discharge back to previous environment  Consults called:  none Admission status and Level of care: Telemetry Medical:   as inpt       Status is: Inpatient  Remains inpatient appropriate because: Patient has multiple comorbidities, including history of lung cancer, now presents with sepsis due to CAP.  Has new oxygen requirement.  Patient also has elevated troponin 141.  Her presentation is highly complicated.  Patient is at high risk of deteriorating.  Will need to be treated in hospital for at least 2 days.          Date of Service 02/03/2021    Ivor Costa [facility]   If 7PM-7AM, please contact night-coverage [email or web address] 02/03/2021, 2:33 PM

## 2021-02-04 ENCOUNTER — Inpatient Hospital Stay: Payer: Medicare Other

## 2021-02-04 DIAGNOSIS — R918 Other nonspecific abnormal finding of lung field: Secondary | ICD-10-CM

## 2021-02-04 DIAGNOSIS — J90 Pleural effusion, not elsewhere classified: Secondary | ICD-10-CM

## 2021-02-04 LAB — CBC
HCT: 40.9 % (ref 36.0–46.0)
Hemoglobin: 12.9 g/dL (ref 12.0–15.0)
MCH: 30.3 pg (ref 26.0–34.0)
MCHC: 31.5 g/dL (ref 30.0–36.0)
MCV: 96 fL (ref 80.0–100.0)
Platelets: 173 10*3/uL (ref 150–400)
RBC: 4.26 MIL/uL (ref 3.87–5.11)
RDW: 13.5 % (ref 11.5–15.5)
WBC: 12.4 10*3/uL — ABNORMAL HIGH (ref 4.0–10.5)
nRBC: 0 % (ref 0.0–0.2)

## 2021-02-04 LAB — BODY FLUID CELL COUNT WITH DIFFERENTIAL
Eos, Fluid: 0 %
Lymphs, Fluid: 88 %
Monocyte-Macrophage-Serous Fluid: 2 %
Neutrophil Count, Fluid: 10 %
Total Nucleated Cell Count, Fluid: 319 cu mm

## 2021-02-04 LAB — LIPID PANEL
Cholesterol: 157 mg/dL (ref 0–200)
HDL: 61 mg/dL (ref >40)
LDL Cholesterol: 84 mg/dL (ref 0–99)
Total CHOL/HDL Ratio: 2.6 RATIO
Triglycerides: 61 mg/dL (ref <150)
VLDL: 12 mg/dL (ref 0–40)

## 2021-02-04 LAB — GLUCOSE, PLEURAL OR PERITONEAL FLUID: Glucose, Fluid: 161 mg/dL

## 2021-02-04 MED ORDER — MOMETASONE FURO-FORMOTEROL FUM 200-5 MCG/ACT IN AERO
2.0000 | INHALATION_SPRAY | Freq: Two times a day (BID) | RESPIRATORY_TRACT | Status: DC
  Administered 2021-02-04 – 2021-02-05 (×3): 2 via RESPIRATORY_TRACT
  Filled 2021-02-04: qty 8.8

## 2021-02-04 NOTE — Care Management Important Message (Signed)
Important Message  Patient Details  Name: Krystal Greene MRN: [id number] Date of Birth: 03-03-1935   Medicare Important Message Given:  N/A - LOS <3 / Initial given by admissions     Dannette Barbara 02/04/2021, 6:07 PM

## 2021-02-04 NOTE — Procedures (Signed)
PROCEDURE SUMMARY:  Successful US guided right thoracentesis. Yielded 900 ml of clear yellow fluid. Pt tolerated procedure well. No immediate complications.  Specimen sent for labs. CXR ordered; no post-procedure pneumothorax identified  EBL < 2 mL  Theresa Duty, NP 02/04/2021 2:56 PM

## 2021-02-04 NOTE — Progress Notes (Signed)
[facility] [place] NAME: Meyli Boice    MR#:  [id number]  DATE OF BIRTH:  02/21/1935  SUBJECTIVE:  patient came in with increasing shortness of breath cough and congestion. She has history of COPD emphysema in her sats dropped down in the lower 80s at home per family. Patient not in any respiratory distress. Her sats are currently more than 92% on oxygen. Son at bedside. Two daughters were on the conference call. REVIEW OF SYSTEMS:   Review of Systems  Constitutional:  Positive for malaise/fatigue. Negative for chills, fever and weight loss.  HENT:  Negative for ear discharge, ear pain and nosebleeds.   Eyes:  Negative for blurred vision, pain and discharge.  Respiratory:  Positive for cough and shortness of breath. Negative for sputum production, wheezing and stridor.   Cardiovascular:  Negative for chest pain, palpitations, orthopnea and PND.  Gastrointestinal:  Negative for abdominal pain, diarrhea, nausea and vomiting.  Genitourinary:  Negative for frequency and urgency.  Musculoskeletal:  Negative for back pain and joint pain.  Neurological:  Positive for weakness. Negative for sensory change, speech change and focal weakness.  Psychiatric/Behavioral:  Negative for depression and hallucinations. The patient is not nervous/anxious.   Tolerating Diet: Tolerating PT:   DRUG ALLERGIES:   Allergies  Allergen Reactions   Hydrocodone Other (See Comments)    Syncope     VITALS:  Blood pressure 140/72, pulse 85, temperature 98.5 F (36.9 C), resp. rate 14, height 5\' 2"  (1.575 m), weight 45.4 kg, SpO2 97 %.  PHYSICAL EXAMINATION:   Physical Exam  GENERAL:  86 y.o.-year-old patient lying in the bed with no acute distress. Thin frail HEENT: Head atraumatic, normocephalic. Oropharynx and nasopharynx clear.  LUNGS decreased breath sounds bilaterally, no wheezing, rales, rhonchi. No use of accessory muscles of respiration.   CARDIOVASCULAR: S1, S2 normal. No murmurs, rubs, or gallops.  ABDOMEN: Soft, nontender, nondistended. Bowel sounds present.  EXTREMITIES: No cyanosis, clubbing or edema b/l.    NEUROLOGIC: nonfocal PSYCHIATRIC:  patient is alert and awake SKIN: No obvious rash, lesion, or ulcer.   LABORATORY PANEL:  CBC Recent Labs  Lab 02/04/21 0416  WBC 12.4*  HGB 12.9  HCT 40.9  PLT 173    Chemistries  Recent Labs  Lab 02/03/21 0607  NA 136  K 4.5  CL 94*  CO2 37*  GLUCOSE 144*  BUN 14  CREATININE 0.54  CALCIUM 8.4*  AST 24  ALT 33  ALKPHOS 108  BILITOT 1.2   Cardiac Enzymes No results for input(s): TROPONINI in the last 168 hours. RADIOLOGY:  DG Chest 2 View  Result Date: 02/03/2021 CLINICAL DATA:  86 year old female with history of shortness of breath. EXAM: CHEST - 2 VIEW COMPARISON:  Chest x-ray 08/05/2020. FINDINGS: Small bilateral pleural effusions. Opacities in the right middle and lower lobe concerning for multilobar right-sided bronchopneumonia. Left lung appears generally clear, with exception of mass-like area in the apex of the left upper lobe estimated to measure approximately 2.0 x 3.2 cm, better demonstrated on recent chest CT. Mild emphysematous changes are noted. No evidence of pulmonary edema. Heart size is normal. Upper mediastinal contours are within normal limits. Atherosclerotic calcifications are noted in the thoracic aorta. IMPRESSION: 1. Findings are concerning for developing bronchopneumonia in the right middle and lower lobes. 2. Small bilateral pleural effusions. 3. Persistent mass in the apex of the left upper lobe highly concerning for neoplasm. Further evaluation with PET-CT is recommended  in the near future to better evaluate this finding. 4. Aortic atherosclerosis. Electronically Signed   By: Vinnie Langton M.D.   On: 02/03/2021 06:33   US RENAL  Result Date: 02/03/2021 CLINICAL DATA:  Renal lesion on CT scan. EXAM: RENAL / URINARY TRACT ULTRASOUND  COMPLETE COMPARISON:  CT chest examination dated February 01, 2021 FINDINGS: Right Kidney: Renal measurements: 11.0 x 4.5 x 4.0 = volume: 104 mL. Echogenicity within normal limits. No mass or hydronephrosis visualized. Left Kidney: Renal measurements: 10.5 x 4.6 x 4.4 = volume: 111 mL. Echogenicity within normal limits. No mass or hydronephrosis visualized. Anechoic avascular cyst in the upper pole of the left kidney measuring 4.4 x 2.4 x 4.0 cm. Bladder: Appears normal for degree of bladder distention. Other: Bilateral pleural effusions. IMPRESSION: 1.  No evidence of nephrolithiasis or hydronephrosis. 2. Anechoic avascular cystic structure in the upper pole of the left kidney measuring 4.4 x 2.4 x 4.0 cm. The previously reported soft tissue density mass in the upper pole of the left kidney, as seen on prior CT examination is not appreciated on this sonogram. Further evaluation with CT urogram would be helpful. Electronically Signed   By: Keane Police D.O.   On: 02/03/2021 16:00   US Venous Img Lower Unilateral Right (DVT)  Result Date: 02/03/2021 CLINICAL DATA:  Right leg swelling for 1 month EXAM: RIGHT LOWER EXTREMITY VENOUS DOPPLER ULTRASOUND TECHNIQUE: Gray-scale sonography with graded compression, as well as color Doppler and duplex ultrasound were performed to evaluate the lower extremity deep venous systems from the level of the common femoral vein and including the common femoral, femoral, profunda femoral, popliteal and calf veins including the posterior tibial, peroneal and gastrocnemius veins when visible. The superficial great saphenous vein was also interrogated. Spectral Doppler was utilized to evaluate flow at rest and with distal augmentation maneuvers in the common femoral, femoral and popliteal veins. COMPARISON:  None. FINDINGS: Contralateral Common Femoral Vein: Respiratory phasicity is normal and symmetric with the symptomatic side. No evidence of thrombus. Normal compressibility. Common  Femoral Vein: No evidence of thrombus. Normal compressibility, respiratory phasicity and response to augmentation. Saphenofemoral Junction: No evidence of thrombus. Normal compressibility and flow on color Doppler imaging. Profunda Femoral Vein: No evidence of thrombus. Normal compressibility and flow on color Doppler imaging. Femoral Vein: No evidence of thrombus. Normal compressibility, respiratory phasicity and response to augmentation. Popliteal Vein: No evidence of thrombus. Normal compressibility, respiratory phasicity and response to augmentation. Calf Veins: No evidence of thrombus. Normal compressibility and flow on color Doppler imaging. Other Findings: Right popliteal fossa small Baker's cyst measures 1.9 x 1.0 x 1.8 cm. Femoral peripheral atherosclerosis noted. IMPRESSION: Negative for significant right lower extremity DVT. 1.9 cm right Baker's cyst. Peripheral arterial atherosclerosis. Electronically Signed   By: Jerilynn Mages.  Shick M.D.   On: 02/03/2021 15:47   ASSESSMENT AND PLAN:   ANNIAH GLICK is a 86 y.o. female with medical history significant of left upper lobe lung cancer (s/p of radiation therapy), hypertension, GERD, dementia, tobacco abuse, who presents with shortness breath, low oxygen saturation. Per her daughter at bedside, patient was recently treated for pneumonia with antibiotics by her PCP.  She had oxygen desaturated 70-80% on room air at home.  She normally is not wearing oxygen.    Sepsis with acute hypoxic respiratory failure secondary to pneumonia with COPD and chronic tobacco abuse -- continue current antibiotic -- sepsis improving -- assess for home oxygen -- continue Mucinex, bronchodilators -- blood culture negative --  dressing Doppler negative for DVT  lung mass pleural effusion bilateral -- patient has history of left upper lobe non-small cell lung cancer. Status post radiation about seven years ago -- seems to have recurrent left upper lobe lung mass --  pulmonary consultation with Dr. Lanney Gins -- ultrasound-guided thoracentesis  protein calorie malnutrition moderate -- continue dietary supplements  Renal lesion:  CT of chest on 02/01/2021 incidentally showed a 1.4 cm exophytic rounded density is seen arising from upper pole of left kidney which is increased in size compared to prior exam -get US-renal 1. No evidence of nephrolithiasis or hydronephrosis.  2. Anechoic avascular cystic structure in the upper pole of the left kidney measuring 4.4 x 2.4 x 4.0 cm. The previously reported soft tissue density mass in the upper pole of the left kidney, as seen on prior CT examination is not appreciated on this sonogram. Further evaluation with CT urogram would be helpful.--defer to PCP for out pt w/u   Procedures: Family communication :son at bedside Consults : pulmonary CODE STATUS: DNR DVT Prophylaxis : enoxaparin Level of care: Med-Surg Status is: Inpatient  Remains inpatient appropriate because: y pneumonia and lung mass        TOTAL TIME TAKING CARE OF THIS PATIENT: 25 minutes.  >50% time spent on counselling and coordination of care  Note: This dictation was prepared with Dragon dictation along with smaller phrase technology. Any transcriptional errors that result from this process are unintentional.  Fritzi Mandes M.D    [facility]   CC: Primary care physician; Idelle Crouch, MD Patient ID: Jari Sportsman, female   DOB: 17-Dec-1935, 86 y.o.   MRN: [id number]

## 2021-02-04 NOTE — Consult Note (Signed)
PULMONOLOGY         Date: 02/04/2021,   MRN# [id number] Krystal Greene [birth year]     AdmissionWeight: 45.4 kg                 CurrentWeight: 45.4 kg   Referring physician: Dr Fritzi Mandes   CHIEF COMPLAINT:   Lung mass   HISTORY OF PRESENT ILLNESS   Patient is a pleasant 86 yo F with hx of GERD, HTN, GERD, tobacco smoking who came in with sob/doe. She had pneumonia and was treated with antibioics on outpatient. She was hypoxemic in 70s on arrival.  We discussed pleural effusion bilaterally.   I called family including 2 daughters on phone and we were able to review everything together.   PCCM consultation placed for additional evaluation and management.   PAST MEDICAL HISTORY   Past Medical History:  Diagnosis Date   GERD (gastroesophageal reflux disease)    Lung cancer ([place])    Osteoporosis      SURGICAL HISTORY   Past Surgical History:  Procedure Laterality Date   BACK SURGERY     BREAST EXCISIONAL BIOPSY Right 30+ yrs ago?   neg   CERVICAL FUSION     ESOPHAGEAL DILATION       FAMILY HISTORY   Family History  Problem Relation Age of Onset   Kidney disease Sister      SOCIAL HISTORY   Social History   Tobacco Use   Smoking status: Every Day    Packs/day: 1.00    Types: Cigarettes   Smokeless tobacco: Never  Substance Use Topics   Alcohol use: Yes    Alcohol/week: 1.0 standard drink    Types: 1 Glasses of wine per week    Comment: nightly   Drug use: No     MEDICATIONS    Home Medication:    Current Medication:  Current Facility-Administered Medications:    acetaminophen (TYLENOL) tablet 650 mg, 650 mg, Oral, Q6H PRN, Ivor Costa, MD   albuterol (PROVENTIL) (2.5 MG/3ML) 0.083% nebulizer solution 2.5 mg, 2.5 mg, Nebulization, Q4H PRN, Ivor Costa, MD   aspirin EC tablet 81 mg, 81 mg, Oral, Daily, Ivor Costa, MD, 81 mg at 02/04/21 1035   azithromycin (ZITHROMAX) tablet 500 mg, 500 mg, Oral, Daily, Benita Gutter, RPH,  500 mg at 02/04/21 1035   bisoprolol (ZEBETA) tablet 5 mg, 5 mg, Oral, Daily, Ivor Costa, MD, 5 mg at 02/04/21 1035   cefTRIAXone (ROCEPHIN) 2 g in sodium chloride 0.9 % 100 mL IVPB, 2 g, Intravenous, Q24H, Ivor Costa, MD, Last Rate: 200 mL/hr at 02/04/21 1047, 2 g at 02/04/21 1047   dextromethorphan-guaiFENesin (MUCINEX DM) 30-600 MG per 12 hr tablet 1 tablet, 1 tablet, Oral, BID PRN, Ivor Costa, MD   enoxaparin (LOVENOX) injection 40 mg, 40 mg, Subcutaneous, Q24H, Ivor Costa, MD, 40 mg at 02/03/21 2351   feeding supplement (ENSURE ENLIVE / ENSURE PLUS) liquid 237 mL, 237 mL, Oral, BID BM, Ivor Costa, MD, 237 mL at 02/04/21 1054   hydrALAZINE (APRESOLINE) injection 5 mg, 5 mg, Intravenous, Q2H PRN, Ivor Costa, MD   ipratropium-albuterol (DUONEB) 0.5-2.5 (3) MG/3ML nebulizer solution 3 mL, 3 mL, Nebulization, Q4H, Ivor Costa, MD, 3 mL at 02/04/21 1132   mometasone-formoterol (DULERA) 200-5 MCG/ACT inhaler 2 puff, 2 puff, Inhalation, BID, Fritzi Mandes, MD   nicotine (NICODERM CQ - dosed in mg/24 hours) patch 21 mg, 21 mg, Transdermal, Daily, Ivor Costa, MD, 21 mg at 02/04/21 1035  ondansetron (ZOFRAN) injection 4 mg, 4 mg, Intravenous, Q8H PRN, Ivor Costa, MD    ALLERGIES   Hydrocodone     REVIEW OF SYSTEMS    Review of Systems:  Gen:  Denies  fever, sweats, chills weigh loss  HEENT: Denies blurred vision, double vision, ear pain, eye pain, hearing loss, nose bleeds, sore throat Cardiac:  No dizziness, chest pain or heaviness, chest tightness,edema Resp:   Denies cough or sputum porduction, shortness of breath,wheezing, hemoptysis,  Gi: Denies swallowing difficulty, stomach pain, nausea or vomiting, diarrhea, constipation, bowel incontinence Gu:  Denies bladder incontinence, burning urine Ext:   Denies Joint pain, stiffness or swelling Skin: Denies  skin rash, easy bruising or bleeding or hives Endoc:  Denies polyuria, polydipsia , polyphagia or weight change Psych:   Denies  depression, insomnia or hallucinations   Other:  All other systems negative   VS: BP 132/63 (BP Location: Left Arm)    Pulse 80    Temp (!) 97.4 F (36.3 C)    Resp 15    Ht 5\' 2"  (1.575 m)    Wt 45.4 kg    SpO2 95%    BMI 18.29 kg/m      PHYSICAL EXAM    GENERAL:NAD, no fevers, chills, no weakness no fatigue HEAD: Normocephalic, atraumatic.  EYES: Pupils equal, round, reactive to light. Extraocular muscles intact. No scleral icterus.  MOUTH: Moist mucosal membrane. Dentition intact. No abscess noted.  EAR, NOSE, THROAT: Clear without exudates. No external lesions.  NECK: Supple. No thyromegaly. No nodules. No JVD.  PULMONARY: Diffuse coarse rhonchi right sided +wheezes CARDIOVASCULAR: S1 and S2. Regular rate and rhythm. No murmurs, rubs, or gallops. No edema. Pedal pulses 2+ bilaterally.  GASTROINTESTINAL: Soft, nontender, nondistended. No masses. Positive bowel sounds. No hepatosplenomegaly.  MUSCULOSKELETAL: No swelling, clubbing, or edema. Range of motion full in all extremities.  NEUROLOGIC: Cranial nerves II through XII are intact. No gross focal neurological deficits. Sensation intact. Reflexes intact.  SKIN: No ulceration, lesions, rashes, or cyanosis. Skin warm and dry. Turgor intact.  PSYCHIATRIC: Mood, affect within normal limits. The patient is awake, alert and oriented x 3. Insight, judgment intact.       IMAGING      DG Chest 2 View  Result Date: 02/03/2021 CLINICAL DATA:  86 year old female with history of shortness of breath. EXAM: CHEST - 2 VIEW COMPARISON:  Chest x-ray 08/05/2020. FINDINGS: Small bilateral pleural effusions. Opacities in the right middle and lower lobe concerning for multilobar right-sided bronchopneumonia. Left lung appears generally clear, with exception of mass-like area in the apex of the left upper lobe estimated to measure approximately 2.0 x 3.2 cm, better demonstrated on recent chest CT. Mild emphysematous changes are noted. No evidence  of pulmonary edema. Heart size is normal. Upper mediastinal contours are within normal limits. Atherosclerotic calcifications are noted in the thoracic aorta. IMPRESSION: 1. Findings are concerning for developing bronchopneumonia in the right middle and lower lobes. 2. Small bilateral pleural effusions. 3. Persistent mass in the apex of the left upper lobe highly concerning for neoplasm. Further evaluation with PET-CT is recommended in the near future to better evaluate this finding. 4. Aortic atherosclerosis. Electronically Signed   By: Vinnie Langton M.D.   On: 02/03/2021 06:33   CT CHEST WO CONTRAST  Result Date: 02/02/2021 CLINICAL DATA:  Lung mass. EXAM: CT CHEST WITHOUT CONTRAST TECHNIQUE: Multidetector CT imaging of the chest was performed following the standard protocol without IV contrast. COMPARISON:  May 30, 2017.  FINDINGS: Cardiovascular: Atherosclerosis of thoracic aorta is noted without aneurysm formation. Mild cardiomegaly is noted. No pericardial effusion is noted. Coronary artery calcifications are noted. Mediastinum/Nodes: No enlarged mediastinal or axillary lymph nodes, although evaluation is limited due to the lack of intravenous contrast. Thyroid gland, trachea, and esophagus demonstrate no significant findings. Lungs/Pleura: No pneumothorax is noted. Moderate right pleural effusion is noted. Mild left pleural effusion is noted. 3.4 x 2.0 cm spiculated left apical mass is noted concerning for malignancy. Right middle lobe atelectasis is noted. Upper Abdomen: 1.4 cm exophytic rounded abnormality is seen arising from upper pole of left kidney which is increased in size compared to prior exam. Musculoskeletal: There is interval development sclerotic density involving the posterior portion of the left third rib which may represent metastatic involvement from adjacent probable neoplasm. IMPRESSION: 3.4 x 2.0 cm spiculated left apical mass is noted concerning for malignancy. Further evaluation  with PET scan is recommended. These results will be called to the ordering clinician or representative by the Radiologist Assistant, and communication documented in the PACS or zVision Dashboard. Interval development of sclerotic density involving posterior portion of left third rib which may represent metastatic involvement from adjacent probable left apical lesion. 1.4 cm exophytic rounded density is seen arising from upper pole of left kidney which is increased in size compared to prior exam. It is uncertain if this represents neoplasm or hyperdense cyst. Ultrasound may be performed for further evaluation. Right middle lobe subsegmental atelectasis is noted. Coronary artery calcifications are noted suggesting coronary artery disease. Aortic Atherosclerosis (ICD10-I70.0). Electronically Signed   By: Marijo Conception M.D.   On: 02/02/2021 08:22   US RENAL  Result Date: 02/03/2021 CLINICAL DATA:  Renal lesion on CT scan. EXAM: RENAL / URINARY TRACT ULTRASOUND COMPLETE COMPARISON:  CT chest examination dated February 01, 2021 FINDINGS: Right Kidney: Renal measurements: 11.0 x 4.5 x 4.0 = volume: 104 mL. Echogenicity within normal limits. No mass or hydronephrosis visualized. Left Kidney: Renal measurements: 10.5 x 4.6 x 4.4 = volume: 111 mL. Echogenicity within normal limits. No mass or hydronephrosis visualized. Anechoic avascular cyst in the upper pole of the left kidney measuring 4.4 x 2.4 x 4.0 cm. Bladder: Appears normal for degree of bladder distention. Other: Bilateral pleural effusions. IMPRESSION: 1.  No evidence of nephrolithiasis or hydronephrosis. 2. Anechoic avascular cystic structure in the upper pole of the left kidney measuring 4.4 x 2.4 x 4.0 cm. The previously reported soft tissue density mass in the upper pole of the left kidney, as seen on prior CT examination is not appreciated on this sonogram. Further evaluation with CT urogram would be helpful. Electronically Signed   By: Keane Police D.O.    On: 02/03/2021 16:00   US Venous Img Lower Unilateral Right (DVT)  Result Date: 02/03/2021 CLINICAL DATA:  Right leg swelling for 1 month EXAM: RIGHT LOWER EXTREMITY VENOUS DOPPLER ULTRASOUND TECHNIQUE: Gray-scale sonography with graded compression, as well as color Doppler and duplex ultrasound were performed to evaluate the lower extremity deep venous systems from the level of the common femoral vein and including the common femoral, femoral, profunda femoral, popliteal and calf veins including the posterior tibial, peroneal and gastrocnemius veins when visible. The superficial great saphenous vein was also interrogated. Spectral Doppler was utilized to evaluate flow at rest and with distal augmentation maneuvers in the common femoral, femoral and popliteal veins. COMPARISON:  None. FINDINGS: Contralateral Common Femoral Vein: Respiratory phasicity is normal and symmetric with the symptomatic side. No  evidence of thrombus. Normal compressibility. Common Femoral Vein: No evidence of thrombus. Normal compressibility, respiratory phasicity and response to augmentation. Saphenofemoral Junction: No evidence of thrombus. Normal compressibility and flow on color Doppler imaging. Profunda Femoral Vein: No evidence of thrombus. Normal compressibility and flow on color Doppler imaging. Femoral Vein: No evidence of thrombus. Normal compressibility, respiratory phasicity and response to augmentation. Popliteal Vein: No evidence of thrombus. Normal compressibility, respiratory phasicity and response to augmentation. Calf Veins: No evidence of thrombus. Normal compressibility and flow on color Doppler imaging. Other Findings: Right popliteal fossa small Baker's cyst measures 1.9 x 1.0 x 1.8 cm. Femoral peripheral atherosclerosis noted. IMPRESSION: Negative for significant right lower extremity DVT. 1.9 cm right Baker's cyst. Peripheral arterial atherosclerosis. Electronically Signed   By: Jerilynn Mages.  Shick M.D.   On: 02/03/2021  15:47   US Venous Img Lower Unilateral Right (DVT)  Result Date: 01/16/2021 CLINICAL DATA:  Right lower extremity swelling. EXAM: Right LOWER EXTREMITY VENOUS DOPPLER ULTRASOUND TECHNIQUE: Gray-scale sonography with compression, as well as color and duplex ultrasound, were performed to evaluate the deep venous system(s) from the level of the common femoral vein through the popliteal and proximal calf veins. COMPARISON:  None. FINDINGS: VENOUS Normal compressibility of the common femoral, superficial femoral, and popliteal veins, as well as the visualized calf veins. Visualized portions of profunda femoral vein and great saphenous vein unremarkable. No filling defects to suggest DVT on grayscale or color Doppler imaging. Doppler waveforms show normal direction of venous flow, normal respiratory plasticity and response to augmentation. Limited views of the contralateral common femoral vein are unremarkable. OTHER Mild diffuse subcutaneous edema of the right calf. There is atherosclerotic calcification of the visualized arteries. Limitations: none IMPRESSION: No evidence of DVT in the right lower extremity. Electronically Signed   By: Anner Crete M.D.   On: 01/16/2021 20:47      Right moderate effusion , LUL spiculated nodule    ASSESSMENT/PLAN   Left lung nodule   - s/p radiation for lung cancer in 2016   - will obtain thoracentesis first for diagnostic and therapeutic tap   Moderate pleural effusion on right     - thoracentesis with fluid profile   Acute hypoxemic respiratory failure   She should be able to wean off post thoracentesis     Thank you for allowing me to participate in the care of this patient.  Total face to face encounter time for this patient visit was >45 min. >50% of the time was  spent in counseling and coordination of care.   Patient/Family are satisfied with care plan and all questions have been answered.  This document was prepared using Dragon voice  recognition software and may include unintentional dictation errors.     Ottie Glazier, M.D.  Div[facility] of [place]

## 2021-02-05 MED ORDER — IPRATROPIUM-ALBUTEROL 0.5-2.5 (3) MG/3ML IN SOLN
3.0000 mL | Freq: Four times a day (QID) | RESPIRATORY_TRACT | Status: DC
  Administered 2021-02-05: 3 mL via RESPIRATORY_TRACT
  Filled 2021-02-05: qty 3

## 2021-02-05 MED ORDER — ALBUTEROL SULFATE HFA 108 (90 BASE) MCG/ACT IN AERS: 2.0000 | INHALATION_SPRAY | Freq: Four times a day (QID) | RESPIRATORY_TRACT | 2 refills | Status: DC | PRN

## 2021-02-05 MED ORDER — CEFUROXIME AXETIL 500 MG PO TABS: 500.0000 mg | ORAL_TABLET | Freq: Two times a day (BID) | ORAL | Status: DC

## 2021-02-05 MED ORDER — AZITHROMYCIN 500 MG PO TABS: 500.0000 mg | ORAL_TABLET | Freq: Every day | ORAL | 0 refills | Status: AC

## 2021-02-05 MED ORDER — CEFUROXIME AXETIL 500 MG PO TABS: 500.0000 mg | ORAL_TABLET | Freq: Two times a day (BID) | ORAL | 0 refills | Status: AC

## 2021-02-05 MED ORDER — ENSURE ENLIVE PO LIQD: 237.0000 mL | Freq: Two times a day (BID) | ORAL | 12 refills | Status: DC

## 2021-02-05 MED ORDER — NICOTINE 21 MG/24HR TD PT24: 21.0000 mg | MEDICATED_PATCH | Freq: Every day | TRANSDERMAL | 0 refills | Status: DC

## 2021-02-05 NOTE — Discharge Instructions (Signed)
Smoking cessation advised use your inhalers as needed use your oxygen 2 L per minute continuous

## 2021-02-05 NOTE — TOC Transition Note (Signed)
Transition of Care [facility]) - CM/SW Discharge Note   Patient Details  Name: Krystal [facility] MRN: [id number] Date of Birth: Mar 10, 1935  Transition of Care [facility]) CM/SW Contact:  Magnus Ivan, LCSW Phone Number: 02/05/2021, 1:03 PM   Clinical Narrative:    Patient to DC home today on new o2. Would benefit from home health. Spoke to son Francee Piccolo, then daughters Joelene Millin and [place] via phone. Malachy Mood is POA and main contact. Patient lives alone, has a Psychologist, educational a few days a week. Children are supportive and provide transportation. PCP is Dr. Doy Hutching. Pharmacy is [place]. Patient had [facility] in the past,  unsure of agency and no HH preference.  Referral made to [facility] with [facility] for [facility] RN and PT. Referral made to [facility] with [facility] for oxygen. Both are aware of DC home today.    Final next level of care: [place] Barriers to Discharge: Barriers Resolved   Patient Goals and CMS Choice Patient states their goals for this hospitalization and ongoing recovery are:: home with home health CMS Medicare.gov Compare Post Acute Care list provided to:: Patient Represenative (must comment) Choice offered to / list presented to : Adult Children  Discharge Placement                    Patient and family notified of of transfer: 02/05/21  Discharge Plan and Services                DME Arranged: Oxygen DME Agency: [employer] Date DME Agency Contacted: 02/05/21   Representative spoke with at DME Agency: Suanne Marker HH Arranged: PT, RN [facility] Agency: [place] ([place]) Date [place]: 02/05/21   Representative spoke with at [place]: [place] ([place]) Interventions     Readmission Risk Interventions No flowsheet data found.

## 2021-02-05 NOTE — Discharge Summary (Signed)
Krystal [facility]  DATE OF DISCHARGE: 02/05/2021  PRIMARY CARE PHYSICIAN: Krystal Crouch, [facility]    ADMISSION DIAGNOSIS:  CAP (community acquired pneumonia) [J18.9] Acute respiratory failure with hypoxia ([place]) [J96.01] Renal lesion [N28.9] Right leg swelling [M79.89] CAP (community acquired pneumonia) due to Chlamydia species [J16.0] Community acquired pneumonia of right lung, unspecified part of lung [J18.9]  DISCHARGE DIAGNOSIS:  acute respiratory failure with hypoxia secondary to emphysema/COPD in the setting of pneumonia now on oxygen  SECONDARY DIAGNOSIS:   Past Medical History:  Diagnosis Date   GERD (gastroesophageal reflux disease)    Lung cancer ([place])    Osteoporosis     HOSPITAL COURSE:  Krystal [facility] is a 86 y.o. female with medical history significant of left upper lobe lung cancer (s/p of radiation therapy), hypertension, GERD, dementia, tobacco abuse, who presents with shortness breath, low oxygen saturation. Per her daughter at bedside, patient was recently treated for pneumonia with antibiotics by her PCP.  She had oxygen desaturated 70-80% on room air at home.  She normally is not wearing oxygen.     Sepsis with acute hypoxic respiratory failure secondary to pneumonia with COPD and chronic tobacco abuse -- continue current antibiotic -- sepsis improving -- assess for home oxygen-- did qualify for home -- continue Mucinex, bronchodilators -- blood culture negative -- ultrasound Doppler negative for DVT   lung mass pleural effusion bilateral -- patient has history of left upper lobe non-small cell lung cancer. Status post radiation about seven years ago -- seems to have recurrent left upper lobe lung mass -- pulmonary consultation with Krystal [facility] -- ultrasound-guided  thoracentesis-- 900 mL of fluid removed. Cytology pending. -- Patient overall breathing much better. Should follow-up with pulmonology on her scheduled appointment January 25th   protein calorie malnutrition moderate -- continue dietary supplements   Renal lesion:  CT of chest on 02/01/2021 incidentally showed a 1.4 cm exophytic rounded density is seen arising from upper pole of left kidney which is increased in size compared to prior exam -get US-renal 1. No evidence of nephrolithiasis or hydronephrosis.  2. Anechoic avascular cystic structure in the upper pole of the left kidney measuring 4.4 x 2.4 x 4.0 cm. The previously reported soft tissue density mass in the upper pole of the left kidney, as seen on prior CT examination is not appreciated on this sonogram. Further evaluation with CT urogram would be helpful.--defer to PCP for out pt w/u     Procedures: thoracentesis right side Family communication :son at bedside 1/13 Consults : pulmonary CODE STATUS: DNR DVT Prophylaxis : enoxaparin Level of care: Med-Surg Status is: Inpatient   overall hemodynamically stable. Per RN she ambulated with her in the hallways. Oxygen will be set up. Discharge instructions given to patient and son at bed. Son voiced understanding CONSULTS OBTAINED:  Treatment Team:  Krystal Glazier, [facility]  DRUG ALLERGIES:   Allergies  Allergen Reactions   Hydrocodone Other (See Comments)    Syncope     DISCHARGE MEDICATIONS:   Allergies as of 02/05/2021       Reactions   Hydrocodone Other (See Comments)   Syncope         Medication List     STOP taking these medications    aspirin EC 81 MG tablet   bisacodyl  5 MG EC tablet Commonly known as: DULCOLAX   cefdinir 300 MG capsule Commonly known as: OMNICEF   donepezil 5 MG tablet Commonly known as: ARICEPT   ibuprofen 200 MG tablet Commonly known as: ADVIL   levofloxacin 500 MG tablet Commonly known as: LEVAQUIN   lidocaine 5 % Commonly  known as: LIDODERM   omeprazole 40 MG capsule Commonly known as: PRILOSEC   predniSONE 10 MG tablet Commonly known as: DELTASONE   senna-docusate 8.6-50 MG tablet Commonly known as: Senokot-S   traMADol 50 MG tablet Commonly known as: ULTRAM   triamcinolone cream 0.1 % Commonly known as: KENALOG   vitamin B-12 1000 MCG tablet Commonly known as: CYANOCOBALAMIN   Vitamin D3 125 MCG (5000 UT) Tabs       TAKE these medications    Advair Diskus 250-50 MCG/ACT Aepb Generic drug: fluticasone-salmeterol Inhale 1 puff into the lungs 2 (two) times daily.   albuterol 108 (90 Base) MCG/ACT inhaler Commonly known as: VENTOLIN HFA Inhale 2 puffs into the lungs every 6 (six) hours as needed for wheezing or shortness of breath.   alendronate 70 MG tablet Commonly known as: FOSAMAX Take 70 mg by mouth every Sunday at 6pm.   azithromycin 500 MG tablet Commonly known as: ZITHROMAX Take 1 tablet (500 mg total) by mouth daily for 4 doses. Start taking on: February 06, 2021   bisoprolol-hydrochlorothiazide 5-6.25 MG tablet Commonly known as: ZIAC Take 1 tablet by mouth daily.   cefUROXime 500 MG tablet Commonly known as: CEFTIN Take 1 tablet (500 mg total) by mouth 2 (two) times daily with a meal for 8 doses. Start taking on: February 06, 2021   feeding supplement Liqd Take 237 mLs by mouth 2 (two) times daily between meals.   nicotine 21 mg/24hr patch Commonly known as: NICODERM CQ - dosed in mg/24 hours Place 1 patch (21 mg total) onto the skin daily. Start taking on: February 06, 2021               Durable Medical Equipment  (From admission, onward)           Start     Ordered   02/05/21 1008  For home use only DME oxygen  Once       Question Answer Comment  Length of Need Lifetime   Mode or (Route) Nasal cannula   Liters per Minute 2   Frequency Continuous (stationary and portable oxygen unit needed)   Oxygen conserving device Yes   Oxygen delivery  system Gas      01 /13/23 1008            If you experience worsening of your admission symptoms, develop shortness of breath, life threatening emergency, suicidal or homicidal thoughts you must seek medical attention immediately by calling 911 or calling your [facility] immediately  if symptoms less severe.  You Must read complete instructions/literature along with all the possible adverse reactions/side effects for all the Medicines you take and that have been prescribed to you. Take any new Medicines after you have completely understood and accept all the possible adverse reactions/side effects.   Please note  You were cared for by a hospitalist during your hospital stay. If you have any questions about your discharge medications or the care you received while you were in the hospital after you are discharged, you can call the unit and asked to speak with the hospitalist on call if the hospitalist that took care of you is not available. Once  you are discharged, your primary care physician will handle any further medical issues. Please note that NO REFILLS for any discharge medications will be authorized once you are discharged, as it is imperative that you return to your primary care physician (or establish a relationship with a primary care physician if you do not have one) for your aftercare needs so that they can reassess your need for medications and monitor your lab values. Today   SUBJECTIVE   I'm doing okay. Son at bedside.  VITAL SIGNS:  Blood pressure 118/61, pulse 80, temperature 98 F (36.7 C), resp. rate 18, height 5\' 2"  (1.575 m), weight 45.4 kg, SpO2 96 %.  I/O:   Intake/Output Summary (Last 24 hours) at 02/05/2021 1211 Last data filed at 02/05/2021 1027 Gross per 24 hour  Intake 303.52 ml  Output 0 ml  Net 303.52 ml    PHYSICAL EXAMINATION:  GENERAL:  86 y.o.-year-old patient lying in the bed with no acute distress. Thin frail LUNGS: distant breath sounds bilaterally, no  wheezing, rales,rhonchi or crepitation. No use of accessory muscles of respiration.  CARDIOVASCULAR: S1, S2 normal. No murmurs, rubs, or gallops.  ABDOMEN: Soft, non-tender, non-distended. Bowel sounds present. No organomegaly or mass.  EXTREMITIES: No pedal edema, cyanosis, or clubbing.  NEUROLOGIC: non-focal PSYCHIATRIC:  patient is alert and awake.  SKIN: No obvious rash, lesion, or ulcer.   DATA REVIEW:   CBC  Recent Labs  Lab 02/04/21 0416  WBC 12.4*  HGB 12.9  HCT 40.9  PLT 173    Chemistries  Recent Labs  Lab 02/03/21 0607  NA 136  K 4.5  CL 94*  CO2 37*  GLUCOSE 144*  BUN 14  CREATININE 0.54  CALCIUM 8.4*  AST 24  ALT 33  ALKPHOS 108  BILITOT 1.2    Microbiology Results   Recent Results (from the past 240 hour(s))  Resp Panel by RT-PCR (Flu A&B, Covid) Nasopharyngeal Swab     Status: None   Collection Time: 02/03/21  6:08 AM   Specimen: Nasopharyngeal Swab; Nasopharyngeal(NP) swabs in vial transport medium  Result Value Ref Range Status   SARS Coronavirus 2 by RT PCR NEGATIVE NEGATIVE Final    Comment: (NOTE) SARS-CoV-2 target nucleic acids are NOT DETECTED.  The SARS-CoV-2 RNA is generally detectable in upper respiratory specimens during the acute phase of infection. The lowest concentration of SARS-CoV-2 viral copies this assay can detect is 138 copies/mL. A negative result does not preclude SARS-Cov-2 infection and should not be used as the sole basis for treatment or other patient management decisions. A negative result may occur with  improper specimen collection/handling, submission of specimen other than nasopharyngeal swab, presence of viral mutation(s) within the areas targeted by this assay, and inadequate number of viral copies(<138 copies/mL). A negative result must be combined with clinical observations, patient history, and epidemiological information. The expected result is Negative.  Fact Sheet for Patients:   EntrepreneurPulse.com.au  Fact Sheet for Healthcare Providers:  IncredibleEmployment.be  This test is no t yet approved or cleared by the [place] FDA and  has been authorized for detection and/or diagnosis of SARS-CoV-2 by FDA under an Emergency Use Authorization (EUA). This EUA will remain  in effect (meaning this test can be used) for the duration of the COVID-19 declaration under Section 564(b)(1) of the Act, 21 U.S.C.section 360bbb-3(b)(1), unless the authorization is terminated  or revoked sooner.       Influenza A by PCR NEGATIVE NEGATIVE Final   Influenza B  by PCR NEGATIVE NEGATIVE Final    Comment: (NOTE) The Xpert Xpress SARS-CoV-2/FLU/RSV plus assay is intended as an aid in the diagnosis of influenza from Nasopharyngeal swab specimens and should not be used as a sole basis for treatment. Nasal washings and aspirates are unacceptable for Xpert Xpress SARS-CoV-2/FLU/RSV testing.  Fact Sheet for Patients: EntrepreneurPulse.com.au  Fact Sheet for Healthcare Providers: IncredibleEmployment.be  This test is not yet approved or cleared by the [place] FDA and has been authorized for detection and/or diagnosis of SARS-CoV-2 by FDA under an Emergency Use Authorization (EUA). This EUA will remain in effect (meaning this test can be used) for the duration of the COVID-19 declaration under Section 564(b)(1) of the Act, 21 U.S.C. section 360bbb-3(b)(1), unless the authorization is terminated or revoked.  Performed at [facility], [address] [place], [place] [address]   Blood culture (routine x 2)     Status: None (Preliminary result)   Collection Time: 02/03/21  8:15 AM   Specimen: BLOOD  Result Value Ref Range Status   Specimen Description BLOOD LEFT ANTECUBITAL  Final   Special Requests   Final    BOTTLES DRAWN AEROBIC AND ANAEROBIC Blood Culture adequate volume   Culture    Final    NO GROWTH 2 DAYS Performed at [facility], [address], [place], [place] [address]    Report Status PENDING  Incomplete  Blood culture (routine x 2)     Status: None (Preliminary result)   Collection Time: 02/03/21  8:15 AM   Specimen: BLOOD  Result Value Ref Range Status   Specimen Description BLOOD BLOOD LEFT FOREARM  Final   Special Requests   Final    BOTTLES DRAWN AEROBIC AND ANAEROBIC Blood Culture adequate volume   Culture   Final    NO GROWTH 2 DAYS Performed at [facility], [address], [place], [place] [address]    Report Status PENDING  Incomplete  Body fluid culture w Gram Stain     Status: None (Preliminary result)   Collection Time: 02/04/21  2:25 PM   Specimen: PATH Cytology Pleural fluid  Result Value Ref Range Status   Specimen Description   Final    PLEURAL Performed at [facility], [address] [place], [place] [address]    Special Requests   Final    NONE Performed at [facility], [address] [place], [place] [address]    Gram Stain   Final    FEW WBC PRESENT, PREDOMINANTLY MONONUCLEAR NO ORGANISMS SEEN    Culture   Final    NO GROWTH < 12 HOURS Performed at [facility], [place] [address], [place], [place] [address]    Report Status PENDING  Incomplete    RADIOLOGY:  US RENAL  Result Date: 02/03/2021 CLINICAL DATA:  Renal lesion on CT scan. EXAM: RENAL / URINARY TRACT ULTRASOUND COMPLETE COMPARISON:  CT chest examination dated February 01, 2021 FINDINGS: Right Kidney: Renal measurements: 11.0 x 4.5 x 4.0 = volume: 104 mL. Echogenicity within normal limits. No mass or hydronephrosis visualized. Left Kidney: Renal measurements: 10.5 x 4.6 x 4.4 = volume: 111 mL. Echogenicity within normal limits. No mass or hydronephrosis visualized. Anechoic avascular cyst in the upper pole of the left kidney measuring 4.4 x 2.4 x 4.0 cm. Bladder: Appears normal for degree of bladder distention. Other:  Bilateral pleural effusions. IMPRESSION: 1.  No evidence of nephrolithiasis or hydronephrosis. 2. Anechoic avascular cystic structure in the  upper pole of the left kidney measuring 4.4 x 2.4 x 4.0 cm. The previously reported soft tissue density mass in the upper pole of the left kidney, as seen on prior CT examination is not appreciated on this sonogram. Further evaluation with CT urogram would be helpful. Electronically Signed   By: Keane Police D.O.   On: 02/03/2021 16:00   US Venous Img Lower Unilateral Right (DVT)  Result Date: 02/03/2021 CLINICAL DATA:  Right leg swelling for 1 month EXAM: RIGHT LOWER EXTREMITY VENOUS DOPPLER ULTRASOUND TECHNIQUE: Gray-scale sonography with graded compression, as well as color Doppler and duplex ultrasound were performed to evaluate the lower extremity deep venous systems from the level of the common femoral vein and including the common femoral, femoral, profunda femoral, popliteal and calf veins including the posterior tibial, peroneal and gastrocnemius veins when visible. The superficial great saphenous vein was also interrogated. Spectral Doppler was utilized to evaluate flow at rest and with distal augmentation maneuvers in the common femoral, femoral and popliteal veins. COMPARISON:  None. FINDINGS: Contralateral Common Femoral Vein: Respiratory phasicity is normal and symmetric with the symptomatic side. No evidence of thrombus. Normal compressibility. Common Femoral Vein: No evidence of thrombus. Normal compressibility, respiratory phasicity and response to augmentation. Saphenofemoral Junction: No evidence of thrombus. Normal compressibility and flow on color Doppler imaging. Profunda Femoral Vein: No evidence of thrombus. Normal compressibility and flow on color Doppler imaging. Femoral Vein: No evidence of thrombus. Normal compressibility, respiratory phasicity and response to augmentation. Popliteal Vein: No evidence of thrombus. Normal compressibility,  respiratory phasicity and response to augmentation. Calf Veins: No evidence of thrombus. Normal compressibility and flow on color Doppler imaging. Other Findings: Right popliteal fossa small Baker's cyst measures 1.9 x 1.0 x 1.8 cm. Femoral peripheral atherosclerosis noted. IMPRESSION: Negative for significant right lower extremity DVT. 1.9 cm right Baker's cyst. Peripheral arterial atherosclerosis. Electronically Signed   By: Jerilynn Mages.  Shick M.D.   On: 02/03/2021 15:47   DG Chest Port 1 View  Result Date: 02/04/2021 CLINICAL DATA:  Status post thoracentesis. History of lung cancer. Smoker. EXAM: PORTABLE CHEST 1 VIEW COMPARISON:  02/03/2021 FINDINGS: Almost complete resolution of the previously demonstrated right pleural effusion following thoracentesis. No pneumothorax. Small to moderate-sized left pleural effusion, increased. Stable left apical mass. Interval left lower lobe atelectasis. Stable changes of COPD. The cardiac silhouette remains mildly enlarged. Stable moderate scoliosis and degenerative changes. IMPRESSION: 1. Almost complete resolution of the right pleural effusion following thoracentesis, without pneumothorax. 2. Mildly increased left pleural fluid with associated interval left basilar atelectasis. 3. Stable left apical mass. 4. Stable changes of COPD and mild cardiomegaly. Electronically Signed   By: Claudie Revering M.D.   On: 02/04/2021 14:53   US THORACENTESIS ASP PLEURAL SPACE W/IMG GUIDE  Result Date: 02/04/2021 INDICATION: Patient admitted with pneumonia presents today with bilateral pleural effusions. Interventional radiology asked to perform a therapeutic and diagnostic thoracentesis. EXAM: ULTRASOUND GUIDED THORACENTESIS MEDICATIONS: 1% lidocaine 10 mL COMPLICATIONS: None immediate. PROCEDURE: An ultrasound guided thoracentesis was thoroughly discussed with the patient and questions answered. The benefits, risks, alternatives and complications were also discussed. The patient understands  and wishes to proceed with the procedure. Written consent was obtained. Ultrasound was performed to localize and mark an adequate pocket of fluid in the right chest. The area was then prepped and draped in the normal sterile fashion. 1% Lidocaine was used for local anesthesia. Under ultrasound guidance a 6 Fr Safe-T-Centesis catheter was introduced. Thoracentesis was performed. The catheter was  removed and a dressing applied. FINDINGS: A total of approximately 900 mL of clear yellow fluid was removed. Samples were sent to the laboratory as requested by the clinical team. IMPRESSION: Successful ultrasound guided right thoracentesis yielding 900 ml of pleural fluid. Read by: Soyla Dryer, NP Electronically Signed   By: Markus Daft M.D.   On: 02/04/2021 15:07     CODE STATUS:     Code Status Orders  (From admission, onward)           Start     Ordered   02/03/21 1119  Do not attempt resuscitation (DNR)  Continuous        02/03/21 1118           Code Status History     Date Active Date Inactive Code Status Order ID Comments User Context   02/03/2021 0843 02/03/2021 1118 Full Code [id number]  Krystal Costa, [facility]   03/30/2017 1222 04/01/2017 1721 Full Code [id number]  Epifanio Lesches, [facility]      Advance Directive Documentation    Flowsheet Row Most Recent Value  Type of Advance Directive Healthcare Power of Attorney  Pre-existing out of facility DNR order (yellow form or pink MOST form) --  "MOST" Form in Place? --        TOTAL TIME TAKING CARE OF THIS PATIENT: 40 minutes.    Fritzi Mandes M.D  [facility]    CC: Primary care physician; Krystal Crouch, [facility]

## 2021-02-05 NOTE — Progress Notes (Signed)
Home O2 Qualification Test:  SPO2 Resting on RA: 86% SPO2 Ambulating on RA: 82% SPO2 w/ 2L O2: 94%

## 2021-02-08 LAB — COMP PANEL: LEUKEMIA/LYMPHOMA

## 2021-02-08 LAB — BODY FLUID CULTURE W GRAM STAIN: Culture: NO GROWTH

## 2021-02-08 LAB — CULTURE, BLOOD (ROUTINE X 2)
Culture: NO GROWTH
Culture: NO GROWTH
Special Requests: ADEQUATE
Special Requests: ADEQUATE

## 2021-02-08 LAB — CYTOLOGY - NON PAP

## 2021-03-02 ENCOUNTER — Encounter (HOSPITAL_COMMUNITY): Payer: Self-pay | Admitting: Radiology

## 2021-03-05 LAB — FUNGAL ORGANISM REFLEX

## 2021-03-05 LAB — FUNGUS CULTURE RESULT

## 2021-03-05 LAB — FUNGUS CULTURE WITH STAIN

## 2021-03-22 LAB — MISC LABCORP TEST (SEND OUT): Labcorp test code: 5367

## 2021-04-20 ENCOUNTER — Other Ambulatory Visit: Payer: Self-pay | Admitting: Pulmonary Disease

## 2021-04-20 ENCOUNTER — Other Ambulatory Visit (HOSPITAL_COMMUNITY): Payer: Self-pay | Admitting: Pulmonary Disease

## 2021-04-20 DIAGNOSIS — R911 Solitary pulmonary nodule: Secondary | ICD-10-CM

## 2021-05-19 ENCOUNTER — Ambulatory Visit (HOSPITAL_COMMUNITY)
Admission: RE | Admit: 2021-05-19 | Discharge: 2021-05-19 | Disposition: A | Discharge status: Home / Self Care | Payer: Medicare Other | Source: Ambulatory Visit | Attending: Pulmonary Disease | Admitting: Pulmonary Disease

## 2021-05-19 DIAGNOSIS — R911 Solitary pulmonary nodule: Secondary | ICD-10-CM | POA: Diagnosis present

## 2021-05-19 LAB — GLUCOSE, CAPILLARY: Glucose-Capillary: 154 mg/dL — ABNORMAL HIGH (ref 70–99)

## 2021-05-19 MED ORDER — FLUDEOXYGLUCOSE F - 18 (FDG) INJECTION
5.0000 | Freq: Once | INTRAVENOUS | Status: AC | PRN
  Administered 2021-05-19: 5 via INTRAVENOUS

## 2021-06-17 ENCOUNTER — Other Ambulatory Visit: Payer: Self-pay | Admitting: Pulmonary Disease

## 2021-06-17 DIAGNOSIS — R911 Solitary pulmonary nodule: Secondary | ICD-10-CM

## 2021-10-25 ENCOUNTER — Telehealth: Payer: Self-pay

## 2021-10-25 NOTE — Telephone Encounter (Signed)
Spoke with patient regarding palliative Care services. She declined services at this time. She states she will reach out in the future if needed. Will cancel referral and notify referring provider.

## 2021-10-26 ENCOUNTER — Inpatient Hospital Stay: Payer: Medicare Other

## 2021-10-26 ENCOUNTER — Encounter: Payer: Self-pay | Admitting: *Deleted

## 2021-10-26 ENCOUNTER — Emergency Department: Payer: Medicare Other

## 2021-10-26 ENCOUNTER — Inpatient Hospital Stay
Admit: 2021-10-26 | Discharge: 2021-10-26 | Disposition: A | Discharge status: Home / Self Care | Payer: Medicare Other | Attending: Internal Medicine | Admitting: Internal Medicine

## 2021-10-26 ENCOUNTER — Other Ambulatory Visit: Payer: Self-pay

## 2021-10-26 ENCOUNTER — Inpatient Hospital Stay
Admission: EM | Admit: 2021-10-26 | Discharge: 2021-10-29 | DRG: 190 | Disposition: A | Discharge status: Hospice | Payer: Medicare Other | Attending: Internal Medicine | Admitting: Internal Medicine

## 2021-10-26 DIAGNOSIS — J9621 Acute and chronic respiratory failure with hypoxia: Secondary | ICD-10-CM | POA: Diagnosis present

## 2021-10-26 DIAGNOSIS — Z9981 Dependence on supplemental oxygen: Secondary | ICD-10-CM | POA: Diagnosis not present

## 2021-10-26 DIAGNOSIS — Z66 Do not resuscitate: Secondary | ICD-10-CM | POA: Diagnosis present

## 2021-10-26 DIAGNOSIS — Z9221 Personal history of antineoplastic chemotherapy: Secondary | ICD-10-CM

## 2021-10-26 DIAGNOSIS — Z515 Encounter for palliative care: Secondary | ICD-10-CM

## 2021-10-26 DIAGNOSIS — E872 Acidosis, unspecified: Secondary | ICD-10-CM | POA: Diagnosis present

## 2021-10-26 DIAGNOSIS — N179 Acute kidney failure, unspecified: Secondary | ICD-10-CM | POA: Diagnosis present

## 2021-10-26 DIAGNOSIS — F17210 Nicotine dependence, cigarettes, uncomplicated: Secondary | ICD-10-CM | POA: Diagnosis present

## 2021-10-26 DIAGNOSIS — I110 Hypertensive heart disease with heart failure: Secondary | ICD-10-CM | POA: Diagnosis present

## 2021-10-26 DIAGNOSIS — F0394 Unspecified dementia, unspecified severity, with anxiety: Secondary | ICD-10-CM | POA: Diagnosis present

## 2021-10-26 DIAGNOSIS — J439 Emphysema, unspecified: Secondary | ICD-10-CM | POA: Diagnosis present

## 2021-10-26 DIAGNOSIS — Z85118 Personal history of other malignant neoplasm of bronchus and lung: Secondary | ICD-10-CM

## 2021-10-26 DIAGNOSIS — J441 Chronic obstructive pulmonary disease with (acute) exacerbation: Principal | ICD-10-CM

## 2021-10-26 DIAGNOSIS — J189 Pneumonia, unspecified organism: Secondary | ICD-10-CM | POA: Diagnosis present

## 2021-10-26 DIAGNOSIS — I080 Rheumatic disorders of both mitral and aortic valves: Secondary | ICD-10-CM | POA: Diagnosis present

## 2021-10-26 DIAGNOSIS — E875 Hyperkalemia: Secondary | ICD-10-CM | POA: Diagnosis present

## 2021-10-26 DIAGNOSIS — K219 Gastro-esophageal reflux disease without esophagitis: Secondary | ICD-10-CM | POA: Diagnosis present

## 2021-10-26 DIAGNOSIS — I5033 Acute on chronic diastolic (congestive) heart failure: Secondary | ICD-10-CM | POA: Diagnosis present

## 2021-10-26 DIAGNOSIS — Z1152 Encounter for screening for COVID-19: Secondary | ICD-10-CM | POA: Diagnosis not present

## 2021-10-26 DIAGNOSIS — Z981 Arthrodesis status: Secondary | ICD-10-CM

## 2021-10-26 DIAGNOSIS — E871 Hypo-osmolality and hyponatremia: Secondary | ICD-10-CM | POA: Diagnosis not present

## 2021-10-26 DIAGNOSIS — E162 Hypoglycemia, unspecified: Secondary | ICD-10-CM | POA: Diagnosis present

## 2021-10-26 DIAGNOSIS — I509 Heart failure, unspecified: Secondary | ICD-10-CM

## 2021-10-26 DIAGNOSIS — J9622 Acute and chronic respiratory failure with hypercapnia: Secondary | ICD-10-CM | POA: Diagnosis present

## 2021-10-26 DIAGNOSIS — J440 Chronic obstructive pulmonary disease with acute lower respiratory infection: Secondary | ICD-10-CM | POA: Diagnosis present

## 2021-10-26 DIAGNOSIS — R918 Other nonspecific abnormal finding of lung field: Secondary | ICD-10-CM | POA: Diagnosis present

## 2021-10-26 DIAGNOSIS — Z923 Personal history of irradiation: Secondary | ICD-10-CM

## 2021-10-26 DIAGNOSIS — N390 Urinary tract infection, site not specified: Secondary | ICD-10-CM | POA: Diagnosis present

## 2021-10-26 DIAGNOSIS — Z885 Allergy status to narcotic agent status: Secondary | ICD-10-CM

## 2021-10-26 DIAGNOSIS — Z79899 Other long term (current) drug therapy: Secondary | ICD-10-CM | POA: Diagnosis not present

## 2021-10-26 DIAGNOSIS — Z7189 Other specified counseling: Secondary | ICD-10-CM | POA: Diagnosis not present

## 2021-10-26 DIAGNOSIS — Z841 Family history of disorders of kidney and ureter: Secondary | ICD-10-CM

## 2021-10-26 DIAGNOSIS — J90 Pleural effusion, not elsewhere classified: Secondary | ICD-10-CM | POA: Diagnosis present

## 2021-10-26 DIAGNOSIS — Z7951 Long term (current) use of inhaled steroids: Secondary | ICD-10-CM

## 2021-10-26 DIAGNOSIS — I350 Nonrheumatic aortic (valve) stenosis: Secondary | ICD-10-CM

## 2021-10-26 DIAGNOSIS — Z7983 Long term (current) use of bisphosphonates: Secondary | ICD-10-CM

## 2021-10-26 DIAGNOSIS — R6510 Systemic inflammatory response syndrome (SIRS) of non-infectious origin without acute organ dysfunction: Secondary | ICD-10-CM

## 2021-10-26 DIAGNOSIS — M810 Age-related osteoporosis without current pathological fracture: Secondary | ICD-10-CM | POA: Diagnosis present

## 2021-10-26 LAB — SARS CORONAVIRUS 2 BY RT PCR: SARS Coronavirus 2 by RT PCR: NEGATIVE

## 2021-10-26 LAB — COMPREHENSIVE METABOLIC PANEL
ALT: 18 U/L (ref 0–44)
AST: 28 U/L (ref 15–41)
Albumin: 3.9 g/dL (ref 3.5–5.0)
Alkaline Phosphatase: 69 U/L (ref 38–126)
Anion gap: 8 (ref 5–15)
BUN: 25 mg/dL — ABNORMAL HIGH (ref 8–23)
CO2: 31 mmol/L (ref 22–32)
Calcium: 8.8 mg/dL — ABNORMAL LOW (ref 8.9–10.3)
Chloride: 92 mmol/L — ABNORMAL LOW (ref 98–111)
Creatinine, Ser: 0.64 mg/dL (ref 0.44–1.00)
GFR, Estimated: >60 mL/min (ref >60)
Glucose, Bld: 215 mg/dL — ABNORMAL HIGH (ref 70–99)
Potassium: 4.6 mmol/L (ref 3.5–5.1)
Sodium: 131 mmol/L — ABNORMAL LOW (ref 135–145)
Total Bilirubin: 0.5 mg/dL (ref 0.3–1.2)
Total Protein: 7.2 g/dL (ref 6.5–8.1)

## 2021-10-26 LAB — CBC
HCT: 37.8 % (ref 36.0–46.0)
Hemoglobin: 12 g/dL (ref 12.0–15.0)
MCH: 29.9 pg (ref 26.0–34.0)
MCHC: 31.7 g/dL (ref 30.0–36.0)
MCV: 94.3 fL (ref 80.0–100.0)
Platelets: 288 10*3/uL (ref 150–400)
RBC: 4.01 MIL/uL (ref 3.87–5.11)
RDW: 12.8 % (ref 11.5–15.5)
WBC: 15 10*3/uL — ABNORMAL HIGH (ref 4.0–10.5)
nRBC: 0 % (ref 0.0–0.2)

## 2021-10-26 LAB — RESPIRATORY PANEL BY PCR

## 2021-10-26 LAB — CBC WITH DIFFERENTIAL/PLATELET
Abs Immature Granulocytes: 0.03 10*3/uL (ref 0.00–0.07)
Basophils Absolute: 0.1 10*3/uL (ref 0.0–0.1)
Basophils Relative: 1 %
Eosinophils Absolute: 0.4 10*3/uL (ref 0.0–0.5)
Eosinophils Relative: 3 %
HCT: 39.7 % (ref 36.0–46.0)
Hemoglobin: 12.8 g/dL (ref 12.0–15.0)
Immature Granulocytes: 0 %
Lymphocytes Relative: 14 %
Lymphs Abs: 1.9 10*3/uL (ref 0.7–4.0)
MCH: 30.4 pg (ref 26.0–34.0)
MCHC: 32.2 g/dL (ref 30.0–36.0)
MCV: 94.3 fL (ref 80.0–100.0)
Monocytes Absolute: 0.8 10*3/uL (ref 0.1–1.0)
Monocytes Relative: 6 %
Neutro Abs: 10.1 10*3/uL — ABNORMAL HIGH (ref 1.7–7.7)
Neutrophils Relative %: 76 %
Platelets: 341 10*3/uL (ref 150–400)
RBC: 4.21 MIL/uL (ref 3.87–5.11)
RDW: 13 % (ref 11.5–15.5)
WBC: 13.4 10*3/uL — ABNORMAL HIGH (ref 4.0–10.5)
nRBC: 0 % (ref 0.0–0.2)

## 2021-10-26 LAB — BODY FLUID CELL COUNT WITH DIFFERENTIAL
Eos, Fluid: 0 %
Lymphs, Fluid: 68 %
Monocyte-Macrophage-Serous Fluid: 7 %
Neutrophil Count, Fluid: 25 %
Total Nucleated Cell Count, Fluid: 79 cu mm

## 2021-10-26 LAB — CBG MONITORING, ED: Glucose-Capillary: 170 mg/dL — ABNORMAL HIGH (ref 70–99)

## 2021-10-26 LAB — URINALYSIS, ROUTINE W REFLEX MICROSCOPIC
Bilirubin Urine: NEGATIVE
Glucose, UA: NEGATIVE mg/dL
Ketones, ur: NEGATIVE mg/dL
Leukocytes,Ua: NEGATIVE
Nitrite: NEGATIVE
Protein, ur: NEGATIVE mg/dL
Specific Gravity, Urine: 1.03 (ref 1.005–1.030)
pH: 5 (ref 5.0–8.0)

## 2021-10-26 LAB — LACTIC ACID, PLASMA
Lactic Acid, Venous: 2 mmol/L (ref 0.5–1.9)
Lactic Acid, Venous: 1.2 mmol/L (ref 0.5–1.9)

## 2021-10-26 LAB — GLUCOSE, CAPILLARY
Glucose-Capillary: 282 mg/dL — ABNORMAL HIGH (ref 70–99)
Glucose-Capillary: 43 mg/dL — CL (ref 70–99)
Glucose-Capillary: 181 mg/dL — ABNORMAL HIGH (ref 70–99)

## 2021-10-26 LAB — CREATININE, SERUM
Creatinine, Ser: 0.73 mg/dL (ref 0.44–1.00)
GFR, Estimated: >60 mL/min (ref >60)

## 2021-10-26 LAB — BRAIN NATRIURETIC PEPTIDE: B Natriuretic Peptide: 1015.5 pg/mL — ABNORMAL HIGH (ref 0.0–100.0)

## 2021-10-26 LAB — STREP PNEUMONIAE URINARY ANTIGEN: Strep Pneumo Urinary Antigen: NEGATIVE

## 2021-10-26 LAB — TROPONIN I (HIGH SENSITIVITY)
Troponin I (High Sensitivity): 18 ng/L — ABNORMAL HIGH (ref <18)
Troponin I (High Sensitivity): 25 ng/L — ABNORMAL HIGH (ref <18)

## 2021-10-26 LAB — PROCALCITONIN: Procalcitonin: <0.1 ng/mL

## 2021-10-26 MED ORDER — ONDANSETRON HCL 4 MG/2ML IJ SOLN
4.0000 mg | Freq: Once | INTRAMUSCULAR | Status: AC
  Administered 2021-10-26: 4 mg via INTRAVENOUS
  Filled 2021-10-26: qty 2

## 2021-10-26 MED ORDER — ONDANSETRON HCL 4 MG/2ML IJ SOLN: 4.0000 mg | Freq: Four times a day (QID) | INTRAMUSCULAR | Status: DC | PRN

## 2021-10-26 MED ORDER — IPRATROPIUM-ALBUTEROL 0.5-2.5 (3) MG/3ML IN SOLN
3.0000 mL | Freq: Four times a day (QID) | RESPIRATORY_TRACT | Status: DC
  Administered 2021-10-26 (×3): 3 mL via RESPIRATORY_TRACT
  Filled 2021-10-26 (×3): qty 3

## 2021-10-26 MED ORDER — ONDANSETRON HCL 4 MG PO TABS: 4.0000 mg | ORAL_TABLET | Freq: Four times a day (QID) | ORAL | Status: DC | PRN

## 2021-10-26 MED ORDER — IOHEXOL 350 MG/ML SOLN
75.0000 mL | Freq: Once | INTRAVENOUS | Status: AC | PRN
  Administered 2021-10-26: 75 mL via INTRAVENOUS

## 2021-10-26 MED ORDER — LACTATED RINGERS IV BOLUS: 1000.0000 mL | Freq: Once | INTRAVENOUS | Status: DC

## 2021-10-26 MED ORDER — ENOXAPARIN SODIUM 30 MG/0.3ML IJ SOSY
30.0000 mg | PREFILLED_SYRINGE | INTRAMUSCULAR | Status: DC
  Administered 2021-10-26 – 2021-10-29 (×4): 30 mg via SUBCUTANEOUS
  Filled 2021-10-26 (×4): qty 0.3

## 2021-10-26 MED ORDER — IPRATROPIUM-ALBUTEROL 0.5-2.5 (3) MG/3ML IN SOLN
3.0000 mL | Freq: Once | RESPIRATORY_TRACT | Status: AC
Start: 2021-10-26 — End: 2021-10-26
  Administered 2021-10-26: 3 mL via RESPIRATORY_TRACT
  Filled 2021-10-26: qty 3

## 2021-10-26 MED ORDER — ACETAMINOPHEN 500 MG PO TABS
1000.0000 mg | ORAL_TABLET | Freq: Once | ORAL | Status: AC
  Administered 2021-10-26: 1000 mg via ORAL
  Filled 2021-10-26: qty 2

## 2021-10-26 MED ORDER — GUAIFENESIN ER 600 MG PO TB12
600.0000 mg | ORAL_TABLET | Freq: Two times a day (BID) | ORAL | Status: DC
  Administered 2021-10-26 – 2021-10-29 (×7): 600 mg via ORAL
  Filled 2021-10-26 (×7): qty 1

## 2021-10-26 MED ORDER — ACETAMINOPHEN 325 MG PO TABS
650.0000 mg | ORAL_TABLET | Freq: Four times a day (QID) | ORAL | Status: DC | PRN
  Administered 2021-10-28 – 2021-10-29 (×4): 650 mg via ORAL
  Filled 2021-10-26 (×5): qty 2

## 2021-10-26 MED ORDER — FUROSEMIDE 10 MG/ML IJ SOLN
40.0000 mg | Freq: Once | INTRAMUSCULAR | Status: AC
  Administered 2021-10-26: 40 mg via INTRAVENOUS
  Filled 2021-10-26: qty 4

## 2021-10-26 MED ORDER — IPRATROPIUM-ALBUTEROL 0.5-2.5 (3) MG/3ML IN SOLN
3.0000 mL | Freq: Once | RESPIRATORY_TRACT | Status: AC
  Administered 2021-10-26: 3 mL via RESPIRATORY_TRACT
  Filled 2021-10-26: qty 3

## 2021-10-26 MED ORDER — SODIUM CHLORIDE 0.9 % IV SOLN
500.0000 mg | Freq: Once | INTRAVENOUS | Status: AC
  Administered 2021-10-26: 500 mg via INTRAVENOUS
  Filled 2021-10-26: qty 5

## 2021-10-26 MED ORDER — PREDNISONE 20 MG PO TABS
40.0000 mg | ORAL_TABLET | Freq: Every day | ORAL | Status: DC
  Administered 2021-10-27 – 2021-10-29 (×3): 40 mg via ORAL
  Filled 2021-10-26 (×3): qty 2

## 2021-10-26 MED ORDER — ALBUTEROL SULFATE (2.5 MG/3ML) 0.083% IN NEBU
2.5000 mg | INHALATION_SOLUTION | Freq: Once | RESPIRATORY_TRACT | Status: AC
  Administered 2021-10-26: 2.5 mg via RESPIRATORY_TRACT
  Filled 2021-10-26: qty 3

## 2021-10-26 MED ORDER — METHYLPREDNISOLONE SODIUM SUCC 40 MG IJ SOLR
40.0000 mg | Freq: Two times a day (BID) | INTRAMUSCULAR | Status: AC
  Administered 2021-10-26 (×2): 40 mg via INTRAVENOUS
  Filled 2021-10-26 (×2): qty 1

## 2021-10-26 MED ORDER — INSULIN ASPART 100 UNIT/ML IJ SOLN
0.0000 [IU] | Freq: Every day | INTRAMUSCULAR | Status: DC
  Administered 2021-10-27: 2 [IU] via SUBCUTANEOUS
  Filled 2021-10-26 (×2): qty 1

## 2021-10-26 MED ORDER — FUROSEMIDE 10 MG/ML IJ SOLN
20.0000 mg | Freq: Two times a day (BID) | INTRAMUSCULAR | Status: DC
  Administered 2021-10-26 – 2021-10-27 (×3): 20 mg via INTRAVENOUS
  Filled 2021-10-26: qty 2
  Filled 2021-10-26: qty 4
  Filled 2021-10-26: qty 2

## 2021-10-26 MED ORDER — ACETAMINOPHEN 650 MG RE SUPP: 650.0000 mg | Freq: Four times a day (QID) | RECTAL | Status: DC | PRN

## 2021-10-26 MED ORDER — SODIUM CHLORIDE 0.9 % IV SOLN
2.0000 g | Freq: Once | INTRAVENOUS | Status: AC
  Administered 2021-10-26: 2 g via INTRAVENOUS
  Filled 2021-10-26: qty 20

## 2021-10-26 MED ORDER — IPRATROPIUM-ALBUTEROL 0.5-2.5 (3) MG/3ML IN SOLN
3.0000 mL | Freq: Three times a day (TID) | RESPIRATORY_TRACT | Status: DC
  Administered 2021-10-27 – 2021-10-29 (×7): 3 mL via RESPIRATORY_TRACT
  Filled 2021-10-26 (×7): qty 3

## 2021-10-26 MED ORDER — INSULIN ASPART 100 UNIT/ML IJ SOLN
0.0000 [IU] | Freq: Three times a day (TID) | INTRAMUSCULAR | Status: DC
  Administered 2021-10-26: 5 [IU] via SUBCUTANEOUS
  Administered 2021-10-27: 3 [IU] via SUBCUTANEOUS
  Administered 2021-10-27 (×2): 1 [IU] via SUBCUTANEOUS
  Administered 2021-10-28: 7 [IU] via SUBCUTANEOUS
  Filled 2021-10-26 (×5): qty 1

## 2021-10-26 MED ORDER — NICOTINE 21 MG/24HR TD PT24
21.0000 mg | MEDICATED_PATCH | Freq: Every day | TRANSDERMAL | Status: DC
  Filled 2021-10-26 (×4): qty 1

## 2021-10-26 MED ORDER — ALBUTEROL SULFATE (2.5 MG/3ML) 0.083% IN NEBU: 2.5000 mg | INHALATION_SOLUTION | RESPIRATORY_TRACT | Status: DC | PRN

## 2021-10-26 NOTE — ED Notes (Signed)
Pt to ultrasound for thoracentesis at this time.

## 2021-10-26 NOTE — Assessment & Plan Note (Signed)
Possible sepsis Patient with tachycardia, tachypnea, leukocytosis and lactic acidosis Suspect SIRS.  Suspect lactic acidosis secondary to respiratory distress Will hold off on sepsis fluids especially with diagnosis of possible CHF We will continue to monitor closely and initiate sepsis fluids if more convincing for actual sepsis

## 2021-10-26 NOTE — IPAL (Signed)
  Interdisciplinary Goals of Care Family Meeting   Date carried out: 10/26/2021  Location of the meeting: Bedside  Member's involved: Physician and Family Member or next of kin  Durable Power of Attorney or Loss adjuster, chartered: patient, daughter   Discussion: We discussed goals of care for [employer] .    Code status: Full DNR  Disposition: Continue current acute care  Time spent for the meeting: [place], MD  10/26/2021, 5:51 AM

## 2021-10-26 NOTE — Assessment & Plan Note (Addendum)
Multifactorial, mostly to COPD exacerbation but also possible CAP, bilateral pleural effusions and possible CHF though no history of same At baseline wears 3 L and transiently required BiPAP in the ED for the BG showing pH 7.24 with PCO2 78 Treat acute issues as outlined Pulmonology consulted  10/4: On 10 L/min HFNC O2 10/5: On 6-8 L/min HFNC, ongoing episodes of distress, recurrent right pleural effusion. 10/6: on basleine 4 L/min O2

## 2021-10-26 NOTE — Assessment & Plan Note (Addendum)
Moderate aortic stenosis and aortic insufficiency Treated with IV Lasix Echocardiogram stable from prior Daily weights with intake and output monitoring Cardiology consulted, follow up recommendations

## 2021-10-26 NOTE — Assessment & Plan Note (Signed)
Chest CT showing possible basilar consolidation Rocephin and azithromycin Other treatment as outlined above

## 2021-10-26 NOTE — H&P (Signed)
History and Physical    Patient: Krystal Greene UKG:[id number] DOB: February 10, 1935 DOA: 10/26/2021 DOS: the patient was seen and examined on 10/26/2021 PCP: Idelle Crouch, MD  Patient coming from: Home  Chief Complaint:  Chief Complaint  Patient presents with   Shortness of Breath    HPI: Krystal Greene is a 86 y.o. female with medical history significant for left upper lobe lung cancer (s/p of radiation therapy 2015), with recurrent stable left upper lobe lung mass, COPD on home O2 at 3 L, followed by pulmonology, symptomatic moderate aortic stenosis and aortic sufficiency followed by cardiology, hypertension,  dementia, tobacco abuse, who presents with a 3-week history of dyspnea on exertion and wheezing, that acutely worsened on the night of presentation.  She has a congested cough but denies fever or chills or chest pain.  On arrival of EMS she was found to have increased work of breathing and saturating in the low 80s on O2 at her home flow rate.  She was administered DuoNebs, Solu-Medrol and magnesium in route. ED course and data Review: Afebrile, tachycardic at 122 with respirations 34 and O2 sat 99% after being placed on BiPAP.  BP 151/65.  VBG with pH 7.24, PCO2 78 and bicarb 33.  Labs otherwise significant for troponin 25 and BNP 1015.  WBC 13,000 with lactic acid 2.0 and procalcitonin less than 0.10 glucose 259 sodium 131.  COVID-negative. EKG, personally viewed and interpreted showing sinus tachycardia at 123 with no ischemic changes CTA chest shows the following: IMPRESSION: Bilateral pleural effusions with basilar consolidation as described.  Stable spiculated mass lesion in the left apex.  No evidence of pulmonary emboli.  Patient treated with additional DuoNebs, Rocephin and azithromycin as well as IV Lasix.  She was transitioned off of BiPAP to HFNC due to nausea and to avoid aspiration.  Hospitalist consulted for admission.    Past Medical History:  Diagnosis Date    GERD (gastroesophageal reflux disease)    Lung cancer ([place])    Osteoporosis    Past Surgical History:  Procedure Laterality Date   BACK SURGERY     BREAST EXCISIONAL BIOPSY Right 30+ yrs ago?   neg   CERVICAL FUSION     ESOPHAGEAL DILATION     Social History:  reports that she has been smoking cigarettes. She has been smoking an average of 1 pack per day. She has never used smokeless tobacco. She reports current alcohol use of about 1.0 standard drink of alcohol per week. She reports that she does not use drugs.  Allergies  Allergen Reactions   Hydrocodone Other (See Comments)    Syncope     Family History  Problem Relation Age of Onset   Kidney disease Sister     Prior to Admission medications   Medication Sig Start Date End Date Taking? Authorizing Provider  ADVAIR DISKUS 250-50 MCG/ACT AEPB Inhale 1 puff into the lungs 2 (two) times daily. 02/01/21   [provider]  albuterol (VENTOLIN HFA) 108 (90 Base) MCG/ACT inhaler Inhale 2 puffs into the lungs every 6 (six) hours as needed for wheezing or shortness of breath. 02/05/21   Fritzi Mandes, MD  alendronate (FOSAMAX) 70 MG tablet Take 70 mg by mouth every Sunday at 6pm. 12/05/15   [provider]  bisoprolol-hydrochlorothiazide (ZIAC) 5-6.25 MG tablet Take 1 tablet by mouth daily. 03/02/17   [provider]  feeding supplement (ENSURE ENLIVE / ENSURE PLUS) LIQD Take 237 mLs by mouth 2 (two) times daily  between meals. 02/05/21   Fritzi Mandes, MD  nicotine (NICODERM CQ - DOSED IN MG/24 HOURS) 21 mg/24hr patch Place 1 patch (21 mg total) onto the skin daily. 02/06/21   Fritzi Mandes, MD    Physical Exam: Vitals:   10/26/21 0340 10/26/21 0400 10/26/21 0415 10/26/21 0430  BP: 96/61 119/63    Pulse: (!) 126 (!) 123 (!) 124 (!) 122  Resp: (!) 24 19 20 20   Temp:      TempSrc:      SpO2: 96% 95%    Weight:      Height:       Physical Exam Vitals and nursing note reviewed.  Constitutional:      General: She  is not in acute distress.    Comments: Conversational dyspnea  HENT:     Head: Normocephalic and atraumatic.  Cardiovascular:     Rate and Rhythm: Regular rhythm. Tachycardia present.     Heart sounds: Normal heart sounds.  Pulmonary:     Effort: Tachypnea present.     Breath sounds: Examination of the right-lower field reveals decreased breath sounds. Examination of the left-lower field reveals decreased breath sounds. Decreased breath sounds and wheezing present.  Abdominal:     Palpations: Abdomen is soft.     Tenderness: There is no abdominal tenderness.  Neurological:     Mental Status: Mental status is at baseline.     Labs on Admission: I have personally reviewed following labs and imaging studies  CBC: Recent Labs  Lab 10/26/21 0043  WBC 13.4*  NEUTROABS 10.1*  HGB 12.8  HCT 39.7  MCV 94.3  PLT 829   Basic Metabolic Panel: Recent Labs  Lab 10/26/21 0043  NA 131*  K 4.6  CL 92*  CO2 31  GLUCOSE 215*  BUN 25*  CREATININE 0.64  CALCIUM 8.8*   GFR: Estimated Creatinine Clearance: 37.2 mL/min (by C-G formula based on SCr of 0.64 mg/dL). Liver Function Tests: Recent Labs  Lab 10/26/21 0043  AST 28  ALT 18  ALKPHOS 69  BILITOT 0.5  PROT 7.2  ALBUMIN 3.9   No results for input(s): "LIPASE", "AMYLASE" in the last 168 hours. No results for input(s): "AMMONIA" in the last 168 hours. Coagulation Profile: No results for input(s): "INR", "PROTIME" in the last 168 hours. Cardiac Enzymes: No results for input(s): "CKTOTAL", "CKMB", "CKMBINDEX", "TROPONINI" in the last 168 hours. BNP (last 3 results) No results for input(s): "PROBNP" in the last 8760 hours. HbA1C: No results for input(s): "HGBA1C" in the last 72 hours. CBG: No results for input(s): "GLUCAP" in the last 168 hours. Lipid Profile: No results for input(s): "CHOL", "HDL", "LDLCALC", "TRIG", "CHOLHDL", "LDLDIRECT" in the last 72 hours. Thyroid Function Tests: No results for input(s): "TSH",  "T4TOTAL", "FREET4", "T3FREE", "THYROIDAB" in the last 72 hours. Anemia Panel: No results for input(s): "VITAMINB12", "FOLATE", "FERRITIN", "TIBC", "IRON", "RETICCTPCT" in the last 72 hours. Urine analysis:    Component Value Date/Time   COLORURINE YELLOW (A) 08/05/2020 1200   APPEARANCEUR HAZY (A) 08/05/2020 1200   LABSPEC 1.014 08/05/2020 1200   PHURINE 6.0 08/05/2020 1200   GLUCOSEU NEGATIVE 08/05/2020 1200   HGBUR NEGATIVE 08/05/2020 1200   BILIRUBINUR NEGATIVE 08/05/2020 1200   KETONESUR NEGATIVE 08/05/2020 1200   PROTEINUR 100 (A) 08/05/2020 1200   NITRITE NEGATIVE 08/05/2020 1200   LEUKOCYTESUR SMALL (A) 08/05/2020 1200    Radiological Exams on Admission: CT Angio Chest PE W and/or Wo Contrast  Result Date: 10/26/2021 CLINICAL DATA:  Shortness  of breath for 2 days, initial encounter EXAM: CT ANGIOGRAPHY CHEST WITH CONTRAST TECHNIQUE: Multidetector CT imaging of the chest was performed using the standard protocol during bolus administration of intravenous contrast. Multiplanar CT image reconstructions and MIPs were obtained to evaluate the vascular anatomy. RADIATION DOSE REDUCTION: This exam was performed according to the departmental dose-optimization program which includes automated exposure control, adjustment of the mA and/or kV according to patient size and/or use of iterative reconstruction technique. CONTRAST:  27mL OMNIPAQUE IOHEXOL 350 MG/ML SOLN COMPARISON:  Chest x-ray from earlier in the same day. FINDINGS: Cardiovascular: Thoracic aorta and its branches demonstrate atherosclerotic calcifications. No aneurysmal dilatation is seen. The pulmonary artery shows a normal branching pattern bilaterally. No filling defect to suggest pulmonary embolism is seen. No cardiac enlargement is noted. Coronary calcifications are seen. Mediastinum/Nodes: Thoracic inlet is within normal limits. No hilar or mediastinal adenopathy is noted. The esophagus is within normal limits. Lungs/Pleura:  Lungs demonstrate evidence of a large area of spiculated architectural distortion in the left apex stable from the prior exam. Bilateral pleural effusions are noted right considerably greater than left with associated lower lobe consolidation. Mild middle lobe atelectatic changes are noted as well. Upper Abdomen: No acute abnormality. Musculoskeletal: Degenerative changes of the thoracic spine are noted. No acute rib fracture is noted. Stable sclerosis in the posterior aspect of the right third rib is seen. Chronic compression deformity is noted at T9 stable in appearance from the prior study. Review of the MIP images confirms the above findings. IMPRESSION: Bilateral pleural effusions with basilar consolidation as described. Stable spiculated mass lesion in the left apex. No evidence of pulmonary emboli. Electronically Signed   By: Inez Catalina M.D.   On: 10/26/2021 03:07   DG Chest Portable 1 View  Result Date: 10/26/2021 CLINICAL DATA:  Shortness of breath. EXAM: PORTABLE CHEST 1 VIEW COMPARISON:  February 04, 2021 FINDINGS: The cardiac silhouette is mildly enlarged. There is a stable left apical mass. Stable, diffuse, chronic appearing increased interstitial lung markings are also noted. Moderate severity right basilar atelectasis and/or infiltrate is seen with mild left basilar atelectasis. There are small bilateral pleural effusions. No pneumothorax is identified. The visualized skeletal structures are unremarkable. IMPRESSION: 1. Stable left apical mass. 2. Moderate severity right basilar atelectasis and/or infiltrate with mild left basilar atelectasis. 3. Small bilateral pleural effusions. Electronically Signed   By: Virgina Norfolk M.D.   On: 10/26/2021 01:03     Data Reviewed: Relevant notes from primary care and specialist visits, past discharge summaries as available in EHR, including Care Everywhere. Prior diagnostic testing as pertinent to current admission diagnoses Updated medications and  problem lists for reconciliation ED course, including vitals, labs, imaging, treatment and response to treatment Triage notes, nursing and pharmacy notes and ED provider's notes Notable results as noted in HPI   Assessment and Plan: * Acute on chronic respiratory failure with hypoxia and hypercapnia (HCC) Multifactorial, mostly to COPD exacerbation but also possible CAP, bilateral pleural effusions and possible CHF though no history of same At baseline wears 3 L and transiently required BiPAP in the ED for the BG showing pH 7.24 with PCO2 78 Currently back on O2 at nasal cannula Treat acute issues as outlined  COPD with acute exacerbation (HCC) Schedule and as needed nebulized bronchodilator treatments, IV steroids Antitussives, flutter valve, incentive spirometer  Acute heart failure (HCC) Moderate aortic stenosis and aortic insufficiency IV Lasix Echocardiogram Daily weights with intake and output monitoring Cardiology consult  CAP (community  acquired pneumonia) Chest CT showing possible basilar consolidation Rocephin and azithromycin Other treatment as outlined above  SIRS (systemic inflammatory response syndrome) ([place]) Possible sepsis Patient with tachycardia, tachypnea, leukocytosis and lactic acidosis Suspect SIRS.  Suspect lactic acidosis secondary to respiratory distress Will hold off on sepsis fluids especially with diagnosis of possible CHF We will continue to monitor closely and initiate sepsis fluids if more convincing for actual sepsis  Bilateral pleural effusion Possibly related to pneumonia, lung mass, CHF We will keep n.p.o. for diagnostic and therapeutic  thoracentesis  History of primary non-small cell carcinoma of left lung Stable spiculated mass left apex Status post chemo about 8 years prior Patient is followed by pulmonology        DVT prophylaxis: Lovenox  Consults: Cardiology, [facility]  Advance Care Planning:   Code Status: Prior   Family  Communication: daughter at bedside  Disposition Plan: Back to previous home environment  Severity of Illness: The appropriate patient status for this patient is INPATIENT. Inpatient status is judged to be reasonable and necessary in order to provide the required intensity of service to ensure the patient's safety. The patient's presenting symptoms, physical exam findings, and initial radiographic and laboratory data in the context of their chronic comorbidities is felt to place them at high risk for further clinical deterioration. Furthermore, it is not anticipated that the patient will be medically stable for discharge from the hospital within 2 midnights of admission.   * I certify that at the point of admission it is my clinical judgment that the patient will require inpatient hospital care spanning beyond 2 midnights from the point of admission due to high intensity of service, high risk for further deterioration and high frequency of surveillance required.*  Author: Athena Masse, MD 10/26/2021 4:40 AM  For on call review [email or web address].

## 2021-10-26 NOTE — ED Notes (Signed)
O2 titrated down to 6L HFNC at this time. O2 93%

## 2021-10-26 NOTE — Progress Notes (Signed)
PHARMACIST - PHYSICIAN COMMUNICATION  CONCERNING:  Enoxaparin (Lovenox) for DVT Prophylaxis    RECOMMENDATION: Patient was prescribed enoxaprin 40mg  q24 hours for VTE prophylaxis.   Filed Weights   10/26/21 0038  Weight: 45 kg (99 lb 3.3 oz)    Body mass index is 18.15 kg/m.  Estimated Creatinine Clearance: 37.2 mL/min (by C-G formula based on SCr of 0.64 mg/dL).  Patient is candidate for enoxaparin 30mg  every 24 hours based on CrCl = 37 ml/min & Weight = 45kg  DESCRIPTION: Pharmacy has adjusted enoxaparin dose per [facility] policy.  Patient is now receiving enoxaparin 30 mg every 24 hours   Renda Rolls, PharmD, MBA 10/26/2021 5:00 AM

## 2021-10-26 NOTE — Progress Notes (Signed)
*  PRELIMINARY RESULTS* Echocardiogram 2D Echocardiogram has been performed.  Sherrie Sport 10/26/2021, 2:38 PM

## 2021-10-26 NOTE — Progress Notes (Signed)
Initiated HFNC at 6 lpm due to periodic desats on standard [place]. O2 sat currently 96

## 2021-10-26 NOTE — ED Triage Notes (Signed)
Pt brought in via ems from home with sob.  Sx began tonight.  Solumedrol given by ems and breathing treatments.  Pt  alert.  Hx copd

## 2021-10-26 NOTE — Procedures (Signed)
PROCEDURE SUMMARY:  Successful US guided right thoracentesis. Yielded 1L of thin red pleural fluid. Pt tolerated procedure well. No immediate complications.  Specimen was sent for labs. CXR ordered.  EBL < 5 mL  Quentavious Rittenhouse PA-C 10/26/2021 3:31 PM

## 2021-10-26 NOTE — Assessment & Plan Note (Signed)
Stable spiculated mass left apex Status post chemo about 8 years prior Patient is followed by pulmonology

## 2021-10-26 NOTE — Assessment & Plan Note (Signed)
Schedule and as needed nebulized bronchodilator treatments, IV steroids Antitussives, flutter valve, incentive spirometer

## 2021-10-26 NOTE — ED Notes (Signed)
Pt placed on bipap by RT

## 2021-10-26 NOTE — ED Notes (Signed)
Pt brought in via ems from home with sob that began tonight.  Hx copd.  Ems gave solumerol, magnesium, breathing treatments.  No chest pain.  Iv in place  pt alert.  Pt has bilateral wheezes heard

## 2021-10-26 NOTE — ED Notes (Signed)
Pt to ct scan.

## 2021-10-26 NOTE — ED Notes (Signed)
Breathing treatment complete.  Pt placed on 3 liters oxygen .

## 2021-10-26 NOTE — Assessment & Plan Note (Addendum)
Possibly related to pneumonia, lung mass, CHF We will keep n.p.o. for diagnostic and therapeutic  thoracentesis

## 2021-10-26 NOTE — Consult Note (Signed)
[facility] Cardiology  CARDIOLOGY CONSULT NOTE  Patient ID: Krystal Greene MRN: [id number] DOB/AGE: 1935-10-31 86 y.o.  Admit date: 10/26/2021 Referring Physician Damita Dunnings Primary Physician [facility] Primary Cardiologist Nehemiah Massed Reason for Consultation congestive heart failure  HPI: 86 year old female referred for evaluation of congestive heart failure.  The patient has known history of COPD, on home O2, with history of left upper lobe lung cancer status post radiation therapy 2015, with recurrent stable left upper lobe lung mass followed by pulmonology.  Patient also has a history of moderate aortic stenosis, and chronic diastolic congestive heart failure.  She presents to [facility] ED with shortness of breath, and hypoxia with oxygen saturation in low 80s on 3 L of O2.  Patient treated with DuoNebs and IV Medrol, with overall clinical improvement.  CT chest reveals bilateral pleural effusions, with stable spiculated mass left apex.  Admission labs notable for normal high-sensitivity troponin (18, 25), and elevated BNP 1015.  White count elevated at 13,400.  Review of systems complete and found to be negative unless listed above     Past Medical History:  Diagnosis Date   GERD (gastroesophageal reflux disease)    Lung cancer ([place])    Osteoporosis     Past Surgical History:  Procedure Laterality Date   BACK SURGERY     BREAST EXCISIONAL BIOPSY Right 30+ yrs ago?   neg   CERVICAL FUSION     ESOPHAGEAL DILATION      (Not in a hospital admission)  Social History   Socioeconomic History   Marital status: Married    Spouse name: Not on file   Number of children: Not on file   Years of education: Not on file   Highest education level: Not on file  Occupational History   Not on file  Tobacco Use   Smoking status: Every Day    Packs/day: 1.00    Types: Cigarettes   Smokeless tobacco: Never  Substance and Sexual Activity   Alcohol use: Yes    Alcohol/week: 1.0 standard drink of alcohol     Types: 1 Glasses of wine per week    Comment: nightly   Drug use: No   Sexual activity: Not on file  Other Topics Concern   Not on file  Social History Narrative   Not on file   Social Determinants of Health   Financial Resource Strain: Not on file  Food Insecurity: Not on file  Transportation Needs: Not on file  Physical Activity: Not on file  Stress: Not on file  Social Connections: Not on file  Intimate Partner Violence: Not on file    Family History  Problem Relation Age of Onset   Kidney disease Sister       Review of systems complete and found to be negative unless listed above      PHYSICAL EXAM  General: Well developed, well nourished, in no acute distress HEENT:  Normocephalic and atramatic Neck:  No JVD.  Lungs: Clear bilaterally to auscultation and percussion. Heart: HRRR . Normal S1 and S2 without gallops or murmurs.  Abdomen: Bowel sounds are positive, abdomen soft and non-tender  Msk:  Back normal, normal gait. Normal strength and tone for age. Extremities: No clubbing, cyanosis or edema.   Neuro: Alert and oriented X 3. Psych:  Good affect, responds appropriately  Labs:   Lab Results  Component Value Date   WBC 15.0 (H) 10/26/2021   HGB 12.0 10/26/2021   HCT 37.8 10/26/2021   MCV 94.3 10/26/2021  PLT 288 10/26/2021    Recent Labs  Lab 10/26/21 0043 10/26/21 0508  NA 131*  --   K 4.6  --   CL 92*  --   CO2 31  --   BUN 25*  --   CREATININE 0.64 0.73  CALCIUM 8.8*  --   PROT 7.2  --   BILITOT 0.5  --   ALKPHOS 69  --   ALT 18  --   AST 28  --   GLUCOSE 215*  --    Lab Results  Component Value Date   TROPONINI <0.03 06/30/2014    Lab Results  Component Value Date   CHOL 157 02/04/2021   Lab Results  Component Value Date   HDL 61 02/04/2021   Lab Results  Component Value Date   LDLCALC 84 02/04/2021   Lab Results  Component Value Date   TRIG 61 02/04/2021   Lab Results  Component Value Date   CHOLHDL 2.6  02/04/2021   No results found for: "LDLDIRECT"    Radiology: CT Angio Chest PE W and/or Wo Contrast  Result Date: 10/26/2021 CLINICAL DATA:  Shortness of breath for 2 days, initial encounter EXAM: CT ANGIOGRAPHY CHEST WITH CONTRAST TECHNIQUE: Multidetector CT imaging of the chest was performed using the standard protocol during bolus administration of intravenous contrast. Multiplanar CT image reconstructions and MIPs were obtained to evaluate the vascular anatomy. RADIATION DOSE REDUCTION: This exam was performed according to the departmental dose-optimization program which includes automated exposure control, adjustment of the mA and/or kV according to patient size and/or use of iterative reconstruction technique. CONTRAST:  41mL OMNIPAQUE IOHEXOL 350 MG/ML SOLN COMPARISON:  Chest x-ray from earlier in the same day. FINDINGS: Cardiovascular: Thoracic aorta and its branches demonstrate atherosclerotic calcifications. No aneurysmal dilatation is seen. The pulmonary artery shows a normal branching pattern bilaterally. No filling defect to suggest pulmonary embolism is seen. No cardiac enlargement is noted. Coronary calcifications are seen. Mediastinum/Nodes: Thoracic inlet is within normal limits. No hilar or mediastinal adenopathy is noted. The esophagus is within normal limits. Lungs/Pleura: Lungs demonstrate evidence of a large area of spiculated architectural distortion in the left apex stable from the prior exam. Bilateral pleural effusions are noted right considerably greater than left with associated lower lobe consolidation. Mild middle lobe atelectatic changes are noted as well. Upper Abdomen: No acute abnormality. Musculoskeletal: Degenerative changes of the thoracic spine are noted. No acute rib fracture is noted. Stable sclerosis in the posterior aspect of the right third rib is seen. Chronic compression deformity is noted at T9 stable in appearance from the prior study. Review of the MIP images  confirms the above findings. IMPRESSION: Bilateral pleural effusions with basilar consolidation as described. Stable spiculated mass lesion in the left apex. No evidence of pulmonary emboli. Electronically Signed   By: Inez Catalina M.D.   On: 10/26/2021 03:07   DG Chest Portable 1 View  Result Date: 10/26/2021 CLINICAL DATA:  Shortness of breath. EXAM: PORTABLE CHEST 1 VIEW COMPARISON:  February 04, 2021 FINDINGS: The cardiac silhouette is mildly enlarged. There is a stable left apical mass. Stable, diffuse, chronic appearing increased interstitial lung markings are also noted. Moderate severity right basilar atelectasis and/or infiltrate is seen with mild left basilar atelectasis. There are small bilateral pleural effusions. No pneumothorax is identified. The visualized skeletal structures are unremarkable. IMPRESSION: 1. Stable left apical mass. 2. Moderate severity right basilar atelectasis and/or infiltrate with mild left basilar atelectasis. 3. Small bilateral pleural effusions.  Electronically Signed   By: Virgina Norfolk M.D.   On: 10/26/2021 01:03    EKG: Sinus tachycardia at 123 bpm, with LVH with repolarization abnormality  ASSESSMENT AND PLAN:   1.  Acute on chronic diastolic congestive heart failure, with elevated BNP, improved after IV furosemide 2.  Moderate aortic stenosis and aortic insufficiency 3.  Acute on chronic respiratory failure, with history of underlying COPD, on chronic O2, improved after nebulizer treatment and IV Solu-Medrol 4.  Community-acquired pneumonia 5.  Bilateral pleural effusion 6.  History of primary non-small cell carcinoma left lung with stable spiculated mass left apex  Recommendations  1.  Agree with current therapy 2.  Continue diuresis 3.  Carefully monitor renal status 4.  Review 2D echocardiogram  Signed: Isaias Cowman MD,PhD, [facility] 10/26/2021, 12:08 PM

## 2021-10-26 NOTE — ED Provider Notes (Signed)
[facility] Provider Note    Event Date/Time   First MD Initiated Contact with Patient 10/26/21 [id number]     (approximate)   History   Shortness of Breath   HPI  Krystal Greene is a 86 y.o. female with past medical history of COPD/emphysema on 3 L nasal cannula chronically, severe mitral regurg, moderate aortic stenosis who presents with shortness of breath.  Symptoms started acutely tonight.  She denies cough chest pain fever chills or recent illnesses.  Denies nausea vomiting diarrhea.  When EMS arrived apparently she was fairly somnolent refused CPAP received 10 mg of albuterol 1 Atrovent 125 Solu-Medrol 2 g of mag and improved clinically.  EMS tells me patient is on antibiotic for UTI.  Patient denies urinary symptoms.  I spoke with the patient's daughters who are both at the bedside.  They note that over the last 6 weeks she has had somewhat of a physical decline.  She is feeling more short of breath primarily in the morning and she has these spells where she is acutely short of breath and typically resolves with using her inhaler.  She follows closely with pulmonology and cardiology, has been diagnosed with MR and AS but they are not pursuing any surgical treatments..  She lives alone but has someone with her 24/7.  This evening she became short of breath which initially improved with using her inhaler when she went to sleep.  Then woke up more short of breath and asked her daughter to call an ambulance.  Apparently the patient is typically quite resistant to come to the emergency department does not want to be hospitalized.  They are also in close contact with the primary care doctor, Dr. Doy Hutching who recently did labs and diagnosed her with a UTI and has prescribed cefuroxime which she has not yet taken.  Patient has no urinary symptoms.  Dr. Doy Hutching is also referred them to palliative care.  Family is still interested in pursuing palliative care however I do see a note  in the chart that patient spoke with palliative care and ultimately declined but her daughters were not aware of this.  Past Medical History:  Diagnosis Date   GERD (gastroesophageal reflux disease)    Lung cancer ([place])    Osteoporosis     Patient Active Problem List   Diagnosis Date Noted   Pleural effusion on right    Lung mass    CAP (community acquired pneumonia) 02/03/2021   Right leg swelling 02/03/2021   Renal lesion 02/03/2021   Protein-calorie malnutrition, moderate (HCC) 02/03/2021   GERD (gastroesophageal reflux disease)    Lung cancer (HCC)    HTN (hypertension)    Tobacco use    Sepsis ([place])    Elevated troponin    Acute respiratory failure with hypoxia ([place]) 03/31/2017   Acute respiratory failure with hypoxemia (HCC) 03/30/2017     Physical Exam  Triage Vital Signs: ED Triage Vitals [10/26/21 0038]  Enc Vitals Group     BP      Pulse      Resp      Temp      Temp src      SpO2      Weight 99 lb 3.3 oz (45 kg)     Height 5\' 2"  (1.575 m)     Head Circumference      Peak Flow      Pain Score      Pain Loc  Pain Edu?      Excl. in [place]?     Most recent vital signs: Vitals:   10/26/21 0340 10/26/21 0400  BP: 96/61 119/63  Pulse: (!) 126 (!) 123  Resp: (!) 24 19  Temp:    SpO2: 96% 95%     General: Awake, no distress.  Thin chronically ill-appearing CV:  Good peripheral perfusion.  No peripheral edema Resp:  Normal effort.  Patient is able to speak in full sentences she is tachypneic and has wheezing with decreased air movement Abd:  No distention.  Soft and nontender Neuro:             Awake, Alert, Oriented x 3  Other:     ED Results / Procedures / Treatments  Labs (all labs ordered are listed, but only abnormal results are displayed) Labs Reviewed  COMPREHENSIVE METABOLIC PANEL - Abnormal; Notable for the following components:      Result Value   Sodium 131 (*)    Chloride 92 (*)    Glucose, Bld 215 (*)    BUN 25 (*)    Calcium  8.8 (*)    All other components within normal limits  BRAIN NATRIURETIC PEPTIDE - Abnormal; Notable for the following components:   B Natriuretic Peptide 1,015.5 (*)    All other components within normal limits  CBC WITH DIFFERENTIAL/PLATELET - Abnormal; Notable for the following components:   WBC 13.4 (*)    Neutro Abs 10.1 (*)    All other components within normal limits  BLOOD GAS, VENOUS - Abnormal; Notable for the following components:   pH, Ven 7.24 (*)    pCO2, Ven 78 (*)    pO2, Ven 74 (*)    Bicarbonate 33.4 (*)    Acid-Base Excess 3.4 (*)    All other components within normal limits  TROPONIN I (HIGH SENSITIVITY) - Abnormal; Notable for the following components:   Troponin I (High Sensitivity) 18 (*)    All other components within normal limits  SARS CORONAVIRUS 2 BY RT PCR  CULTURE, BLOOD (ROUTINE X 2)  CULTURE, BLOOD (ROUTINE X 2)  PROCALCITONIN  URINALYSIS, ROUTINE W REFLEX MICROSCOPIC  LACTIC ACID, PLASMA  LACTIC ACID, PLASMA  TROPONIN I (HIGH SENSITIVITY)     EKG  EKG shows sinus tachycardia with left anterior fascicular block and left axis deviation significant LVH and repolarization abnormality   RADIOLOGY CT angio of the chest reviewed and interpreted by myself shows large bilateral pleural effusions   PROCEDURES:  Critical Care performed: Yes, see critical care procedure note(s)  .1-3 Lead EKG Interpretation  Performed by: Rada Hay, MD Authorized by: Rada Hay, MD     Interpretation: abnormal     ECG rate assessment: tachycardic     Rhythm: sinus tachycardia     Ectopy: none     Conduction: normal   .Critical Care  Performed by: Rada Hay, MD Authorized by: Rada Hay, MD   Critical care provider statement:    Critical care time (minutes):  30   Critical care was time spent personally by me on the following activities:  Development of treatment plan with patient or surrogate, discussions with consultants,  evaluation of patient's response to treatment, examination of patient, ordering and review of laboratory studies, ordering and review of radiographic studies, ordering and performing treatments and interventions, pulse oximetry, re-evaluation of patient's condition and review of old charts   The patient is on the cardiac monitor to evaluate for evidence  of arrhythmia and/or significant heart rate changes.   MEDICATIONS ORDERED IN ED: Medications  ipratropium-albuterol (DUONEB) 0.5-2.5 (3) MG/3ML nebulizer solution 3 mL (3 mLs Nebulization Given 10/26/21 0051)  ipratropium-albuterol (DUONEB) 0.5-2.5 (3) MG/3ML nebulizer solution 3 mL (3 mLs Nebulization Given 10/26/21 0051)  ipratropium-albuterol (DUONEB) 0.5-2.5 (3) MG/3ML nebulizer solution 3 mL (3 mLs Nebulization Given 10/26/21 0052)  cefTRIAXone (ROCEPHIN) 2 g in sodium chloride 0.9 % 100 mL IVPB (0 g Intravenous Stopped 10/26/21 0240)  azithromycin (ZITHROMAX) 500 mg in sodium chloride 0.9 % 250 mL IVPB (0 mg Intravenous Stopped 10/26/21 0342)  albuterol (PROVENTIL) (2.5 MG/3ML) 0.083% nebulizer solution 2.5 mg (2.5 mg Nebulization Given 10/26/21 0200)  iohexol (OMNIPAQUE) 350 MG/ML injection 75 mL (75 mLs Intravenous Contrast Given 10/26/21 0242)  furosemide (LASIX) injection 40 mg (40 mg Intravenous Given 10/26/21 0341)  ondansetron (ZOFRAN) injection 4 mg (4 mg Intravenous Given 10/26/21 0341)  acetaminophen (TYLENOL) tablet 1,000 mg (1,000 mg Oral Given 10/26/21 0342)     IMPRESSION / MDM / ASSESSMENT AND PLAN / ED COURSE  I reviewed the triage vital signs and the nursing notes.                              Patient's presentation is most consistent with acute presentation with potential threat to life or bodily function.  Differential diagnosis includes, but is not limited to, COPD exacerbation, CHF, pneumonia, pneumothorax, pulmonary embolism  The patient is a 86 year old female with COPD and MR and AS who presents with cute onset of  shortness of breath this evening.  She was hypoxic with EMS satting in the 80s unclear if she was on her home oxygen or not at this time.  Also was apparently fairly somnolent.  Received albuterol Atrovent Solu-Medrol magnesium.  Upon arrival to ED patient is mentating normally she is able to speak in full sentences but is tachypneic decreased air movement and wheezing throughout.  Appears euvolemic.  Suspect COPD exacerbation.  Will check chest x-ray labs and treat with DuoNebs.    Patient's blood gas shows pH 7-4 with PCO2 78.  Troponin mildly elevated at 18.  BNP significantly elevated over thousand.  COVID test is negative.  Does have a leukocytosis.  Initial chest x-ray looks like an infiltrate at the right base so I did give a dose of ceftriaxone azithromycin.  On reassessment patient is mentating okay but does have increased work of breathing.  Given this and her hypercarbia I did place her on BiPAP.  CTA was obtained to rule out PE and to further evaluate the lung parenchyma which shows no PE there are bilateral pleural effusions and some consolidative changes primarily on the right.  Question whether this is more of a CHF picture although with her significant wheezing and decreased air movement do feel that there is also concomitant bronchospasm.  We will give a dose of IV Lasix.  I have added on a procalcitonin.  Patient is agreeable to be admitted.   Of note patient was taken off BiPAP to go to CT when she returned she complained of some nausea so was not put back on.  On reassessment she is speaking full sentences not altered think we can avoid putting her back on the BiPAP.  She is on 3 L nasal cannula at this time.  FINAL CLINICAL IMPRESSION(S) / ED DIAGNOSES   Final diagnoses:  COPD exacerbation ([place])  Pleural effusion  Acute on chronic respiratory  failure with hypoxia and hypercapnia ([place])     Rx / DC Orders   ED Discharge Orders     None        Note:  This document was  prepared using Dragon voice recognition software and may include unintentional dictation errors.   Rada Hay, MD 10/26/21 [phone]

## 2021-10-26 NOTE — ED Notes (Signed)
O2 titrated down to 4L. O2 at 100% at this time.

## 2021-10-26 NOTE — Care Plan (Signed)
This 86 years old female with PMH significant for left upper lobe lung cancer(s/p radiation therapy in 2015) with recurrent stable left upper lobe mass, COPD on home oxygen at 3 L, regularly follows up with pulmonology, symptomatic moderate aortic stenosis and aortic insufficiency regularly follows with cardiology, dementia, hypertension, tobacco abuse presented in the ED with 3-week history of shortness of breath on exertion and wheezing.  Patient was significantly hypoxic on arrival with increased work of breathing.  SPO2 80% on her home oxygen level.  She was given DuoNeb, Solu-Medrol and magnesium in route.  She was tachypneic, tachycardic with SPO2 of 99% after she is being placed on BiPAP.  CTA chest shows bilateral pleural effusion with bibasilar consolidation.  Stable spiculated mass lesion in the left apex, no evidence of pulmonary embolism.  Patient is admitted for acute on chronic hypoxic and hypercapnic respiratory failure requiring BiPAP.  She is started on IV antibiotics and diuresis.  Cardiology is consulted recommended to continue current treatment.  Patient was seen and examined at bedside.  She reports feeling better.

## 2021-10-26 NOTE — ED Notes (Signed)
Echo at bedside

## 2021-10-27 ENCOUNTER — Inpatient Hospital Stay: Payer: Medicare Other

## 2021-10-27 DIAGNOSIS — J9622 Acute and chronic respiratory failure with hypercapnia: Secondary | ICD-10-CM | POA: Diagnosis not present

## 2021-10-27 DIAGNOSIS — E875 Hyperkalemia: Secondary | ICD-10-CM | POA: Diagnosis not present

## 2021-10-27 DIAGNOSIS — N179 Acute kidney failure, unspecified: Secondary | ICD-10-CM | POA: Diagnosis not present

## 2021-10-27 DIAGNOSIS — E162 Hypoglycemia, unspecified: Secondary | ICD-10-CM | POA: Diagnosis not present

## 2021-10-27 DIAGNOSIS — J9621 Acute and chronic respiratory failure with hypoxia: Secondary | ICD-10-CM | POA: Diagnosis not present

## 2021-10-27 LAB — BASIC METABOLIC PANEL
Anion gap: 9 (ref 5–15)
BUN: 37 mg/dL — ABNORMAL HIGH (ref 8–23)
CO2: 34 mmol/L — ABNORMAL HIGH (ref 22–32)
Calcium: 8.7 mg/dL — ABNORMAL LOW (ref 8.9–10.3)
Chloride: 92 mmol/L — ABNORMAL LOW (ref 98–111)
Creatinine, Ser: 1.2 mg/dL — ABNORMAL HIGH (ref 0.44–1.00)
GFR, Estimated: 45 mL/min — ABNORMAL LOW (ref >60)
Glucose, Bld: 54 mg/dL — ABNORMAL LOW (ref 70–99)
Potassium: 5.6 mmol/L — ABNORMAL HIGH (ref 3.5–5.1)
Sodium: 135 mmol/L (ref 135–145)

## 2021-10-27 LAB — GLUCOSE, CAPILLARY
Glucose-Capillary: 228 mg/dL — ABNORMAL HIGH (ref 70–99)
Glucose-Capillary: 131 mg/dL — ABNORMAL HIGH (ref 70–99)
Glucose-Capillary: 126 mg/dL — ABNORMAL HIGH (ref 70–99)
Glucose-Capillary: 226 mg/dL — ABNORMAL HIGH (ref 70–99)

## 2021-10-27 LAB — LEGIONELLA PNEUMOPHILA SEROGP 1 UR AG: L. pneumophila Serogp 1 Ur Ag: NEGATIVE

## 2021-10-27 LAB — PATHOLOGIST SMEAR REVIEW

## 2021-10-27 MED ORDER — SODIUM ZIRCONIUM CYCLOSILICATE 5 G PO PACK
5.0000 g | PACK | Freq: Once | ORAL | Status: AC
  Administered 2021-10-27: 5 g via ORAL
  Filled 2021-10-27: qty 1

## 2021-10-27 MED ORDER — SODIUM CHLORIDE 0.9 % IV SOLN
500.0000 mg | Freq: Every day | INTRAVENOUS | Status: DC
  Administered 2021-10-27 – 2021-10-29 (×3): 500 mg via INTRAVENOUS
  Filled 2021-10-27: qty 500
  Filled 2021-10-27: qty 5
  Filled 2021-10-27: qty 500

## 2021-10-27 MED ORDER — SODIUM CHLORIDE 0.9 % IV SOLN
2.0000 g | Freq: Every day | INTRAVENOUS | Status: DC
  Administered 2021-10-27 – 2021-10-29 (×3): 2 g via INTRAVENOUS
  Filled 2021-10-27: qty 20
  Filled 2021-10-27 (×2): qty 2

## 2021-10-27 NOTE — Assessment & Plan Note (Signed)
Noted.  No acute issues.

## 2021-10-27 NOTE — Assessment & Plan Note (Addendum)
K5.6 this morning, also with new AKI. Initial potassium was 4.6. Has been getting IV Lasix -- Monitor BMP -- AKI eval as outlined -- Lokelma 5 g x 1

## 2021-10-27 NOTE — Assessment & Plan Note (Addendum)
Creatinine on admission was normal 0.64, this morning elevated 1.20 after being kept n.p.o. for thoracentesis yesterday.  Also getting diuresis. -- Resume diet -- Stop Lasix -- Monitor BMP daily --Renal ultrasound given report of minimal urine output despite diuretics --Bladder scans to ensure emptying

## 2021-10-27 NOTE — Progress Notes (Signed)
Progress Note   Patient: Krystal Greene ASN:[id number] DOB: Mar 22, 1935 DOA: 10/26/2021     1 DOS: the patient was seen and examined on 10/27/2021   Brief hospital course: 86 years old female with PMH significant for left upper lobe lung cancer(s/p radiation therapy in 2015) with recurrent stable left upper lobe mass, COPD on home oxygen at 3 L, regularly follows up with pulmonology, symptomatic moderate aortic stenosis and aortic insufficiency regularly follows with cardiology, dementia, hypertension, tobacco abuse presented in the ED with 3-week history of shortness of breath on exertion and wheezing.  Patient was significantly hypoxic on arrival with increased work of breathing.  SPO2 80% on her home oxygen level.  She was given DuoNeb, Solu-Medrol and magnesium in route.  She was tachypneic, tachycardic with SPO2 of 99% after she is being placed on BiPAP.  CTA chest shows bilateral pleural effusion with bibasilar consolidation.  Stable spiculated mass lesion in the left apex, no evidence of pulmonary embolism.  Patient is admitted for acute on chronic hypoxic and hypercapnic respiratory failure requiring BiPAP.  She is started on IV antibiotics and diuresis.  Cardiology consulted.  Patient underwent right-sided thoracentesis on 10/3, 1 L pleural fluid removed. 10/4: Creatinine increased from 0.64 up to 1.20, diuresis held off throughout worsening.   Assessment and Plan: * Acute on chronic respiratory failure with hypoxia and hypercapnia (HCC) Multifactorial, mostly to COPD exacerbation but also possible CAP, bilateral pleural effusions and possible CHF though no history of same At baseline wears 3 L and transiently required BiPAP in the ED for the BG showing pH 7.24 with PCO2 78 Currently back on O2 at nasal cannula Treat acute issues as outlined  10/4: On 10 L/min HFNC O2  COPD with acute exacerbation (HCC) Schedule and as needed nebulized bronchodilator treatments, IV  steroids Antitussives, flutter valve, incentive spirometer  Acute on chronic diastolic CHF (congestive heart failure) (HCC) Moderate aortic stenosis and aortic insufficiency IV Lasix Echocardiogram completed, report is pending Daily weights with intake and output monitoring Cardiology consult  CAP (community acquired pneumonia) Chest CT showing possible basilar consolidation Rocephin and azithromycin Other treatment as outlined above  SIRS (systemic inflammatory response syndrome) ([place]) Possible sepsis Patient with tachycardia, tachypnea, leukocytosis and lactic acidosis Suspect SIRS.  Suspect lactic acidosis secondary to respiratory distress Will hold off on sepsis fluids especially with diagnosis of possible CHF We will continue to monitor closely and initiate sepsis fluids if more convincing for actual sepsis  Bilateral pleural effusion Possibly related to pneumonia, lung mass, CHF Patient underwent right-sided thoracentesis on 10/3 with 1 L pleural fluid removed. -- Follow-up pleural fluid cultures and cytology -- Monitor respiratory status -- O2 per protocol -- Pulmonology follow-up outpatient  Hyperkalemia K5.6 this morning, also with new AKI. Initial potassium was 4.6. Has been getting IV Lasix -- Monitor BMP -- AKI eval as outlined -- Lokelma 5 g x 1  AKI (acute kidney injury) ([place]) Creatinine on admission was normal 0.64, this morning elevated 1.20 after being kept n.p.o. for thoracentesis yesterday.  Also getting diuresis. -- Resume diet -- Stop Lasix -- Monitor BMP daily --Renal ultrasound given report of minimal urine output despite diuretics --Bladder scans to ensure emptying  Hypoglycemia Glucose on a.m. BMP was 54 this morning (10/4).  Due to poor p.o. intake while n.p.o. for procedure yesterday. --Resume diet.   --Hypoglycemia protocol.  Aortic stenosis Noted.  No acute issues.  History of primary non-small cell carcinoma of left lung Stable  spiculated mass left  apex Status post chemo about 8 years prior Patient is followed by pulmonology  Lung mass Chronic, noted to be stable on CTA chest on admission 10/3.  Follow-up with pulmonology. Recently underwent PET scan on 05/19/2021 which showed the left apical lesion to be enlarging with low-level metabolic activity possibly secondary to radiation fibrosis although progression cannot be excluded.  CT chest repeat recommended in 6 months.        Subjective: Patient seen with daughter at bedside on rounds morning.  They both confirmed patient typically requires 3 L of oxygen.  Patient reports having turned her oxygen up to 4 L recently at home.  Patient reports breathing has improved since admission.  Confirms she follows with Dr. Loni Muse for pulmonology.   Physical Exam: Vitals:   10/27/21 0417 10/27/21 0749 10/27/21 0845 10/27/21 1225  BP: (!) 109/59 98/67 94/64  970/26  Pulse: (!) 106 (!) 114 (!) 106 (!) 109  Resp:  14 16 14   Temp: 98 F (36.7 C) 98.6 F (37 C) 98.1 F (36.7 C) 98.2 F (36.8 C)  TempSrc: Oral Oral  Oral  SpO2: 93% 100% 100% 100%  Weight:      Height:       General exam: awake, alert, no acute distress, frail HEENT: moist mucus membranes, hearing grossly normal, nasal cannula in place Respiratory system: CTAB with diminished bases, no wheezes, rales or rhonchi, normal respiratory effort at rest on 10 L high flow nasal cannula O2. Cardiovascular system: normal S1/S2, RRR, + JVP, no pedal edema.   Gastrointestinal system: soft, NT, ND Central nervous system: A&O x2. no gross focal neurologic deficits, normal speech Extremities: moves all, no edema, normal tone Skin: dry, intact, normal temperature Psychiatry: normal mood, congruent affect   Data Reviewed:  Notable labs: Potassium 5.6, chloride 92, bicarb 34, glucose 54, BUN 37, creatinine 1.20 from 0.64, calcium 8.7  Family Communication: Daughter at bedside on rounds  Disposition: Status is:  Inpatient Remains inpatient appropriate because: Severity of illness remaining on IV diuretics, IV antibiotics and with ongoing oxygen requirement well above her baseline    Planned Discharge Destination: Home    Time spent: 45 minutes  Author: Ezekiel Slocumb, DO 10/27/2021 2:50 PM  For on call review [email or web address].

## 2021-10-27 NOTE — Assessment & Plan Note (Signed)
Chronic, noted to be stable on CTA chest on admission 10/3.  Follow-up with pulmonology. Recently underwent PET scan on 05/19/2021 which showed the left apical lesion to be enlarging with low-level metabolic activity possibly secondary to radiation fibrosis although progression cannot be excluded.  CT chest repeat recommended in 6 months.

## 2021-10-27 NOTE — Progress Notes (Addendum)
[facility] Cardiology  CARDIOLOGY CONSULT NOTE  Patient ID: Krystal Greene MRN: [id number] DOB/AGE: 08/09/1935 86 y.o.  Admit date: 10/26/2021 Referring Physician Damita Dunnings Primary Physician [facility] Primary Cardiologist Nehemiah Massed Reason for Consultation congestive heart failure  HPI: 86 year old female referred for evaluation of congestive heart failure.  The patient has known history of COPD, on home O2, with history of left upper lobe lung cancer status post radiation therapy 2015, with recurrent stable left upper lobe lung mass followed by pulmonology.  Patient also has a history of moderate aortic stenosis, and chronic diastolic congestive heart failure.  She presents to [facility] ED with shortness of breath, and hypoxia with oxygen saturation in low 80s on 3 L of O2.  Patient treated with DuoNebs and IV Medrol, with overall clinical improvement.  CT chest reveals bilateral pleural effusions, with stable spiculated mass left apex.  Admission labs notable for normal high-sensitivity troponin (18, 25), and elevated BNP 1015.  White count elevated at 13,400.  Interval history: -Had another episode of shortness of breath and anxiety this morning -No chest pain shortness of breath, no palpitations  Review of systems complete and found to be negative unless listed above     Past Medical History:  Diagnosis Date   GERD (gastroesophageal reflux disease)    Lung cancer ([place])    Osteoporosis     Past Surgical History:  Procedure Laterality Date   BACK SURGERY     BREAST EXCISIONAL BIOPSY Right 30+ yrs ago?   neg   CERVICAL FUSION     ESOPHAGEAL DILATION      Medications Prior to Admission  Medication Sig Dispense Refill Last Dose   ADVAIR DISKUS 250-50 MCG/ACT AEPB Inhale 1 puff into the lungs 2 (two) times daily.   10/25/2021   arformoterol (BROVANA) 15 MCG/2ML NEBU Take 2 mLs by nebulization every 12 (twelve) hours.   10/25/2021   cefUROXime (CEFTIN) 250 MG tablet Take 250 mg by mouth 2 (two) times  daily.   10/25/2021   ipratropium-albuterol (DUONEB) 0.5-2.5 (3) MG/3ML SOLN Take 3 mLs by nebulization 2 (two) times daily.   10/25/2021   modafinil (PROVIGIL) 100 MG tablet Take 100 mg by mouth daily.   10/25/2021   albuterol (VENTOLIN HFA) 108 (90 Base) MCG/ACT inhaler Inhale 2 puffs into the lungs every 6 (six) hours as needed for wheezing or shortness of breath. 8 g 2 prn at prn   alendronate (FOSAMAX) 70 MG tablet Take 70 mg by mouth every Sunday at 6pm.   10/24/2021   bisoprolol-hydrochlorothiazide (ZIAC) 5-6.25 MG tablet Take 1 tablet by mouth daily. (Patient not taking: Reported on 10/26/2021)  9 Not Taking   feeding supplement (ENSURE ENLIVE / ENSURE PLUS) LIQD Take 237 mLs by mouth 2 (two) times daily between meals. 237 mL 12    furosemide (LASIX) 20 MG tablet Take 20 mg by mouth daily.   10/24/2021   nicotine (NICODERM CQ - DOSED IN MG/24 HOURS) 21 mg/24hr patch Place 1 patch (21 mg total) onto the skin daily. 28 patch 0 prn at prn   spironolactone (ALDACTONE) 50 MG tablet Take 50 mg by mouth daily. (Patient not taking: Reported on 10/26/2021)   Not Taking    Social History   Socioeconomic History   Marital status: Married    Spouse name: Not on file   Number of children: Not on file   Years of education: Not on file   Highest education level: Not on file  Occupational History   Not on  file  Tobacco Use   Smoking status: Every Day    Packs/day: 1.00    Types: Cigarettes   Smokeless tobacco: Never  Substance and Sexual Activity   Alcohol use: Yes    Alcohol/week: 1.0 standard drink of alcohol    Types: 1 Glasses of wine per week    Comment: nightly   Drug use: No   Sexual activity: Not on file  Other Topics Concern   Not on file  Social History Narrative   Not on file   Social Determinants of Health   Financial Resource Strain: Not on file  Food Insecurity: No Food Insecurity (10/26/2021)   Hunger Vital Sign    Worried About Running Out of Food in the Last Year: Never  true    Ran Out of Food in the Last Year: Never true  Transportation Needs: No Transportation Needs (10/26/2021)   PRAPARE - Hydrologist (Medical): No    Lack of Transportation (Non-Medical): No  Physical Activity: Not on file  Stress: Not on file  Social Connections: Not on file  Intimate Partner Violence: Not At Risk (10/26/2021)   Humiliation, Afraid, Rape, and Kick questionnaire    Fear of Current or Ex-Partner: No    Emotionally Abused: No    Physically Abused: No    Sexually Abused: No    Family History  Problem Relation Age of Onset   Kidney disease Sister      PHYSICAL EXAM General: Elderly and thin Caucasian female, in no acute distress.  Sitting upright in hospital bed with daughter at bedside HEENT:  Normocephalic and atraumatic. Neck:   No JVD.  Lungs: Normal respiratory effort on oxygen by nasal cannula.  Decreased breath sounds bilaterally with trace expiratory wheezes  Heart: Tachycardic but regular. Normal S1 and S2 without gallops no appreciable murmurs. Radial and DP pulses 2+ bilaterally. Abdomen: non-distended appearing.  Msk: Normal strength and tone for age. Extremities: No clubbing, cyanosis, edema.  Neuro: Alert and oriented x3 Psych:  mood appropriate for situation.    Labs:   Lab Results  Component Value Date   WBC 15.0 (H) 10/26/2021   HGB 12.0 10/26/2021   HCT 37.8 10/26/2021   MCV 94.3 10/26/2021   PLT 288 10/26/2021    Recent Labs  Lab 10/26/21 0043 10/26/21 0508  NA 131*  --   K 4.6  --   CL 92*  --   CO2 31  --   BUN 25*  --   CREATININE 0.64 0.73  CALCIUM 8.8*  --   PROT 7.2  --   BILITOT 0.5  --   ALKPHOS 69  --   ALT 18  --   AST 28  --   GLUCOSE 215*  --     Lab Results  Component Value Date   TROPONINI <0.03 06/30/2014    Lab Results  Component Value Date   CHOL 157 02/04/2021   Lab Results  Component Value Date   HDL 61 02/04/2021   Lab Results  Component Value Date   LDLCALC  84 02/04/2021   Lab Results  Component Value Date   TRIG 61 02/04/2021   Lab Results  Component Value Date   CHOLHDL 2.6 02/04/2021   No results found for: "LDLDIRECT"    Radiology: [facility] Chest Port 1 View  Result Date: 10/26/2021 CLINICAL DATA:  Ultrasound guided right thoracentesis EXAM: PORTABLE CHEST 1 VIEW COMPARISON:  10/26/2021 FINDINGS: Single frontal view of the chest  demonstrates a stable cardiac silhouette. Extensive calcification of the mitral annulus. Decreased right pleural effusion after interval thoracentesis. No evidence of pneumothorax. Stable spiculated left apical mass. Persistent left basilar consolidation and left pleural effusion. No acute bony abnormalities. IMPRESSION: 1. No complication after right thoracentesis, with complete resolution of the right pleural effusion seen previously. 2. Persistent left basilar consolidation and effusion. 3. Stable spiculated left apical mass. Electronically Signed   By: Randa Ngo M.D.   On: 10/26/2021 16:03   US THORACENTESIS ASP PLEURAL SPACE W/IMG GUIDE  Result Date: 10/26/2021 INDICATION: Pleural effusion, shortness of breath EXAM: ULTRASOUND GUIDED RIGHT THORACENTESIS MEDICATIONS: None. COMPLICATIONS: None immediate. PROCEDURE: An ultrasound guided thoracentesis was thoroughly discussed with the patient and questions answered. The benefits, risks, alternatives and complications were also discussed. The patient understands and wishes to proceed with the procedure. Written consent was obtained. Ultrasound was performed to localize and mark an adequate pocket of fluid in the right chest. The area was then prepped and draped in the normal sterile fashion. 1% Lidocaine was used for local anesthesia. Under ultrasound guidance a 6 Fr Safe-T-Centesis catheter was introduced. Thoracentesis was performed. The catheter was removed and a dressing applied. FINDINGS: A total of approximately 1L of thin red pleural fluid was removed. Samples were  sent to the laboratory as requested by the clinical team. IMPRESSION: Successful ultrasound guided right thoracentesis yielding 1L of pleural fluid. Performed and dictated by Pasty Spillers, PA-C Electronically Signed   By: Albin Felling M.D.   On: 10/26/2021 15:49   CT Angio Chest PE W and/or Wo Contrast  Result Date: 10/26/2021 CLINICAL DATA:  Shortness of breath for 2 days, initial encounter EXAM: CT ANGIOGRAPHY CHEST WITH CONTRAST TECHNIQUE: Multidetector CT imaging of the chest was performed using the standard protocol during bolus administration of intravenous contrast. Multiplanar CT image reconstructions and MIPs were obtained to evaluate the vascular anatomy. RADIATION DOSE REDUCTION: This exam was performed according to the departmental dose-optimization program which includes automated exposure control, adjustment of the mA and/or kV according to patient size and/or use of iterative reconstruction technique. CONTRAST:  76mL OMNIPAQUE IOHEXOL 350 MG/ML SOLN COMPARISON:  Chest x-ray from earlier in the same day. FINDINGS: Cardiovascular: Thoracic aorta and its branches demonstrate atherosclerotic calcifications. No aneurysmal dilatation is seen. The pulmonary artery shows a normal branching pattern bilaterally. No filling defect to suggest pulmonary embolism is seen. No cardiac enlargement is noted. Coronary calcifications are seen. Mediastinum/Nodes: Thoracic inlet is within normal limits. No hilar or mediastinal adenopathy is noted. The esophagus is within normal limits. Lungs/Pleura: Lungs demonstrate evidence of a large area of spiculated architectural distortion in the left apex stable from the prior exam. Bilateral pleural effusions are noted right considerably greater than left with associated lower lobe consolidation. Mild middle lobe atelectatic changes are noted as well. Upper Abdomen: No acute abnormality. Musculoskeletal: Degenerative changes of the thoracic spine are noted. No acute rib  fracture is noted. Stable sclerosis in the posterior aspect of the right third rib is seen. Chronic compression deformity is noted at T9 stable in appearance from the prior study. Review of the MIP images confirms the above findings. IMPRESSION: Bilateral pleural effusions with basilar consolidation as described. Stable spiculated mass lesion in the left apex. No evidence of pulmonary emboli. Electronically Signed   By: Inez Catalina M.D.   On: 10/26/2021 03:07   DG Chest Portable 1 View  Result Date: 10/26/2021 CLINICAL DATA:  Shortness of breath. EXAM: PORTABLE CHEST 1 VIEW  COMPARISON:  February 04, 2021 FINDINGS: The cardiac silhouette is mildly enlarged. There is a stable left apical mass. Stable, diffuse, chronic appearing increased interstitial lung markings are also noted. Moderate severity right basilar atelectasis and/or infiltrate is seen with mild left basilar atelectasis. There are small bilateral pleural effusions. No pneumothorax is identified. The visualized skeletal structures are unremarkable. IMPRESSION: 1. Stable left apical mass. 2. Moderate severity right basilar atelectasis and/or infiltrate with mild left basilar atelectasis. 3. Small bilateral pleural effusions. Electronically Signed   By: Virgina Norfolk M.D.   On: 10/26/2021 01:03    EKG: Sinus tachycardia at 123 bpm, with LVH with repolarization abnormality  Data reviewed by me: Hospitalist progress note, admission H&P, CBC, BMP, BNP troponins, procalcitonin, vitals, telemetry  ASSESSMENT AND PLAN:   1.  Acute on chronic diastolic congestive heart failure, with elevated BNP, improved after IV furosemide 40x1, 20 mg x 3 (on home Lasix 20 mg every other day) 2.  Moderate aortic stenosis and aortic insufficiency 3.  Acute on chronic respiratory failure, with history of underlying COPD, on chronic O2, improved after nebulizer treatment and IV Solu-Medrol 4.  Community-acquired pneumonia 5.  Bilateral pleural effusion 6.   History of primary non-small cell carcinoma left lung with stable spiculated mass left apex  Recommendations  1.  Agree with current therapy 2.  Addemdum: bmp ordered and resulted with worsening Cr and GFR of 45 (prev >60), and hyperkalemia. Hold further diuresis. 3.  Carefully monitor renal status 4.  2D echocardiogram, pending read  This patient's plan of care was discussed and created with Dr. Saralyn Pilar and he is in agreement.    Signed: Tristan Schroeder, PA-C 10/27/2021, 11:52 AM

## 2021-10-27 NOTE — Assessment & Plan Note (Signed)
Glucose on a.m. BMP was 54 this morning (10/4).  Due to poor p.o. intake while n.p.o. for procedure yesterday. --Resume diet.   --Hypoglycemia protocol.

## 2021-10-28 ENCOUNTER — Inpatient Hospital Stay: Payer: Medicare Other

## 2021-10-28 DIAGNOSIS — J9621 Acute and chronic respiratory failure with hypoxia: Secondary | ICD-10-CM | POA: Diagnosis not present

## 2021-10-28 DIAGNOSIS — Z7189 Other specified counseling: Secondary | ICD-10-CM

## 2021-10-28 DIAGNOSIS — J9622 Acute and chronic respiratory failure with hypercapnia: Secondary | ICD-10-CM | POA: Diagnosis not present

## 2021-10-28 LAB — BASIC METABOLIC PANEL
Anion gap: 10 (ref 5–15)
BUN: 44 mg/dL — ABNORMAL HIGH (ref 8–23)
CO2: 31 mmol/L (ref 22–32)
Calcium: 8.3 mg/dL — ABNORMAL LOW (ref 8.9–10.3)
Chloride: 89 mmol/L — ABNORMAL LOW (ref 98–111)
Creatinine, Ser: 0.93 mg/dL (ref 0.44–1.00)
GFR, Estimated: >60 mL/min (ref >60)
Glucose, Bld: 96 mg/dL (ref 70–99)
Potassium: 4.6 mmol/L (ref 3.5–5.1)
Sodium: 130 mmol/L — ABNORMAL LOW (ref 135–145)

## 2021-10-28 LAB — ECHOCARDIOGRAM COMPLETE
AR max vel: 0.41 cm2
AV Area VTI: 0.37 cm2
AV Area mean vel: 0.37 cm2
AV Mean grad: 31.8 mmHg
AV Peak grad: 47.6 mmHg
Ao pk vel: 3.45 m/s
Area-P 1/2: 6.27 cm2
Height: 62 in
S' Lateral: 3.4 cm
Weight: 1587.31 oz

## 2021-10-28 LAB — CBC
HCT: 34.9 % — ABNORMAL LOW (ref 36.0–46.0)
Hemoglobin: 11.5 g/dL — ABNORMAL LOW (ref 12.0–15.0)
MCH: 30.4 pg (ref 26.0–34.0)
MCHC: 33 g/dL (ref 30.0–36.0)
MCV: 92.3 fL (ref 80.0–100.0)
Platelets: 305 10*3/uL (ref 150–400)
RBC: 3.78 MIL/uL — ABNORMAL LOW (ref 3.87–5.11)
RDW: 13.2 % (ref 11.5–15.5)
WBC: 17.1 10*3/uL — ABNORMAL HIGH (ref 4.0–10.5)
nRBC: 0 % (ref 0.0–0.2)

## 2021-10-28 LAB — GLUCOSE, CAPILLARY
Glucose-Capillary: 80 mg/dL (ref 70–99)
Glucose-Capillary: 138 mg/dL — ABNORMAL HIGH (ref 70–99)
Glucose-Capillary: 350 mg/dL — ABNORMAL HIGH (ref 70–99)
Glucose-Capillary: 179 mg/dL — ABNORMAL HIGH (ref 70–99)

## 2021-10-28 LAB — MAGNESIUM: Magnesium: 2.2 mg/dL (ref 1.7–2.4)

## 2021-10-28 LAB — BRAIN NATRIURETIC PEPTIDE: B Natriuretic Peptide: 2094.6 pg/mL — ABNORMAL HIGH (ref 0.0–100.0)

## 2021-10-28 MED ORDER — MORPHINE SULFATE (PF) 2 MG/ML IV SOLN
0.5000 mg | INTRAVENOUS | Status: DC | PRN
  Administered 2021-10-28: 1 mg via INTRAVENOUS
  Filled 2021-10-28: qty 1

## 2021-10-28 MED ORDER — LEVALBUTEROL HCL 0.63 MG/3ML IN NEBU: 0.6300 mg | INHALATION_SOLUTION | RESPIRATORY_TRACT | Status: DC | PRN

## 2021-10-28 MED ORDER — FUROSEMIDE 10 MG/ML IJ SOLN
40.0000 mg | Freq: Every day | INTRAMUSCULAR | Status: AC
  Administered 2021-10-28 – 2021-10-29 (×2): 40 mg via INTRAVENOUS
  Filled 2021-10-28 (×2): qty 4

## 2021-10-28 NOTE — Evaluation (Signed)
Occupational Therapy Evaluation Patient Details Name: FAIGY STRETCH MRN: [id number] DOB: Sep 19, 1935 Today's Date: 10/28/2021   History of Present Illness Treniece Machnik is an 9yoF who comes to [facility] on 10/26/21 c CC dyspnea, hyoxia, workup revealing of BN 1000s, elevated white count. PMH: COPD on O2, [place] s/p radiation 2015, AS, dCHF.   Clinical Impression   Pt was seen for OT evaluation this date. Prior to hospital admission and prior to the last week, pt was indep with ADL and mobility. Daughter notes that the pt in the past week has required assist for ADL/IADL. Pt presents to acute OT demonstrating impaired ADL performance and functional mobility 2/2 activity tolerance, cardiopulmonary status, and decreased knowledge of AE/DME and proper O2 home use (See OT problem list). Pt currently requires increased time/effort to complete mobility tasks and PRN assist for LB ADL. SpO2 92-93% on 4L, HR 109-113 with limited exertion. Pt/family educated in home/routines modifications, home O2 use, falls prevention, AE/DME, and ECS including activity pacing, work simplification, and pursed lip breathing. Pt/family verbalized understanding. Pt would benefit from skilled OT services to address noted impairments and functional limitations (see below for any additional details) in order to maximize safety and independence while minimizing falls risk and caregiver burden. Do not currently anticipate skilled OT needs upon discharge.    Recommendations for follow up therapy are one component of a multi-disciplinary discharge planning process, led by the attending physician.  Recommendations may be updated based on patient status, additional functional criteria and insurance authorization.   Follow Up Recommendations  No OT follow up    Assistance Recommended at Discharge Intermittent Supervision/Assistance  Patient can return home with the following Assistance with cooking/housework;Assist for  transportation;Help with stairs or ramp for entrance;A little help with bathing/dressing/bathroom    Functional Status Assessment  Patient has had a recent decline in their functional status and demonstrates the ability to make significant improvements in function in a reasonable and predictable amount of time.  Equipment Recommendations  Tub/shower seat    Recommendations for Other Services       Precautions / Restrictions Precautions Precautions: Fall Precaution Comments: *pt permitted to AMB to BR with O2 tubing alone Restrictions Weight Bearing Restrictions: No      Mobility Bed Mobility Overal bed mobility: Modified Independent             General bed mobility comments: increased effort    Transfers                          Balance                                           ADL either performed or assessed with clinical judgement   ADL                                         General ADL Comments: Pt requires PRN assist for ADL tasks, easily SOB with exertion.     Vision         Perception     Praxis      Pertinent Vitals/Pain Pain Assessment Pain Assessment: Faces Faces Pain Scale: Hurts a little bit Pain Location: R rib discomfort Pain Descriptors / Indicators: Grimacing Pain Intervention(s): Limited activity  within patient's tolerance, Monitored during session, Repositioned     Hand Dominance     Extremity/Trunk Assessment Upper Extremity Assessment Upper Extremity Assessment: Generalized weakness   Lower Extremity Assessment Lower Extremity Assessment: Generalized weakness       Communication     Cognition Arousal/Alertness: Awake/alert Behavior During Therapy: WFL for tasks assessed/performed Overall Cognitive Status: Within Functional Limits for tasks assessed                                 General Comments: has had 24/7 assist in home for mild cognitive impairment      General Comments       Exercises Other Exercises Other Exercises: Pt/family educated in ECS and home O2 use for ADL/mobility to improve safety/indep and minimize SOB/over exertion. Other Exercises: Pt/family educated in AE/DME for ADL and IADL to support work simplification and activity pacing   Shoulder Instructions      Home Living Family/patient expects to be discharged to:: Private residence Living Arrangements: Alone Available Help at Discharge: Available 24 hours/day;Personal care attendant;Family Type of Home:  (condo) Home Access: Level entry     Home Layout: One level     Bathroom Shower/Tub: Chief Strategy Officer: Shower seat - built in;Grab bars - tub/shower;Hand held shower head;Grab bars - toilet   Additional Comments: floor concentrator, portable concentrator, home BP unit, Oximeter      Prior Functioning/Environment               Mobility Comments: mostly household AMB, easily SOB ADLs Comments: independent, stands for showering, until last week was indep with ADL/IADL; past week has become more challenging for showering, laundry, etc. requiring assist from family        OT Problem List: Decreased activity tolerance;Decreased knowledge of use of DME or AE      OT Treatment/Interventions: Self-care/ADL training;Therapeutic activities;Energy conservation;DME and/or AE instruction;Patient/family education    OT Goals(Current goals can be found in the care plan section) Acute Rehab OT Goals Patient Stated Goal: breathe better OT Goal Formulation: With patient/family Time For Goal Achievement: 11/11/21 Potential to Achieve Goals: Good ADL Goals Additional ADL Goal #1: Pt will complete shower primarily from seated, with remote supervision, with O2, SpO2 >90%, PRE <6/10 Additional ADL Goal #2: Pt will verbalize plan to implement at least 2 learned ECS into daily ADL/IADL routines.  OT Frequency: Min 2X/week    Co-evaluation               AM-PAC OT "6 Clicks" Daily Activity     Outcome Measure Help from another person eating meals?: None Help from another person taking care of personal grooming?: None Help from another person toileting, which includes using toliet, bedpan, or urinal?: None Help from another person bathing (including washing, rinsing, drying)?: A Little Help from another person to put on and taking off regular upper body clothing?: None Help from another person to put on and taking off regular lower body clothing?: A Little 6 Click Score: 22   End of Session    Activity Tolerance: Patient tolerated treatment well Patient left: in bed;with call bell/phone within reach;with family/visitor present  OT Visit Diagnosis: Other abnormalities of gait and mobility (R26.89)                Time: 1540-0867 OT Time Calculation (min): 34 min Charges:  OT General Charges $OT Visit:  1 Visit OT Evaluation $OT Eval Moderate Complexity: 1 Mod OT Treatments $Self Care/Home Management : 8-22 mins  Ardeth Perfect., MPH, MS, OTR/L ascom [phone] 10/28/21, 2:44 PM

## 2021-10-28 NOTE — Evaluation (Signed)
Physical Therapy Evaluation Patient Details Name: Krystal Greene MRN: [id number] DOB: July 16, 1935 Today's Date: 10/28/2021  History of Present Illness  Krystal Greene is an 56yoF who comes to [facility] on 10/26/21 c CC dyspnea, hyoxia, workup revealing of BN 1000s, elevated white count. PMH: COPD on O2, LuncgCA s/p radiation 2015, AS, dCHF.  Clinical Impression  Pt on 9L on entry, but titrated to 4L/min without any dyspnea or desaturation in session. Pt AMB ad lib to/from BR for toiletting needs. Pt passes all balance screening measures at bedside, attests to baseline level of performance today in strength and balance without any acute impairment of these. Family is also pleased with her mobility today. Education point provided on use of O2 for showering, as well as differences between pulsed and continuous concentrators, and implication of use of O2 for showering (family unaware.) Encouraged pt and family to get back into habit of regular community AMB for avoiding functional decline. Pt moving very well today, no DME needs at DC, no PT needs at DC. PT signing off.      Recommendations for follow up therapy are one component of a multi-disciplinary discharge planning process, led by the attending physician.  Recommendations may be updated based on patient status, additional functional criteria and insurance authorization.  Follow Up Recommendations No PT follow up      Assistance Recommended at Discharge None  Patient can return home with the following  Assistance with cooking/housework;Assist for transportation;Direct supervision/assist for financial management;Direct supervision/assist for medications management    Equipment Recommendations None recommended by PT  Recommendations for Other Services       Functional Status Assessment Patient has had a recent decline in their functional status and demonstrates the ability to make significant improvements in function in a reasonable and  predictable amount of time.     Precautions / Restrictions Precautions Precautions: Fall Precaution Comments: *pt permitted to AMB to BR with O2 tubing alone Restrictions Weight Bearing Restrictions: No      Mobility  Bed Mobility Overal bed mobility: Independent                  Transfers Overall transfer level: Independent Equipment used: None               General transfer comment: performs 5xSTS without LOB or dyspnea    Ambulation/Gait Ambulation/Gait assistance:  (not a focus of evalaution, but has been AMB to/from BR without issue)                Stairs            Wheelchair Mobility    Modified Rankin (Stroke Patients Only)       Balance Overall balance assessment: Independent (No LOB with screening: eyes closed stance, forward backward stepping, lateral stepping.)                                           Pertinent Vitals/Pain Pain Assessment Pain Assessment: No/denies pain    Home Living Family/patient expects to be discharged to:: Private residence Living Arrangements: Alone Available Help at Discharge: Available 24 hours/day;Personal care attendant;Family Type of Home:  (condo) Home Access: Level entry       Home Layout: One level Home Equipment: Shower seat - built in;Grab bars - tub/shower;Hand held shower head;Grab bars - toilet Additional Comments: floor concentrator, portable concentrator, home BP unit, Oximeter  Prior Function               Mobility Comments: mostly household AMB ADLs Comments: independent     Hand Dominance        Extremity/Trunk Assessment                Communication      Cognition Arousal/Alertness: Awake/alert Behavior During Therapy: WFL for tasks assessed/performed Overall Cognitive Status: Within Functional Limits for tasks assessed                                 General Comments: has had 24/7 assist in home for mild cognitive  impairment        General Comments      Exercises     Assessment/Plan    PT Assessment Patient does not need any further PT services  PT Problem List Cardiopulmonary status limiting activity;Decreased activity tolerance       PT Treatment Interventions Functional mobility training;Therapeutic activities;Patient/family education    PT Goals (Current goals can be found in the Care Plan section)  Acute Rehab PT Goals PT Goal Formulation: All assessment and education complete, DC therapy    Frequency       Co-evaluation               AM-PAC PT "6 Clicks" Mobility  Outcome Measure Help needed turning from your back to your side while in a flat bed without using bedrails?: None Help needed moving from lying on your back to sitting on the side of a flat bed without using bedrails?: None Help needed moving to and from a bed to a chair (including a wheelchair)?: None Help needed standing up from a chair using your arms (e.g., wheelchair or bedside chair)?: None Help needed to walk in hospital room?: None Help needed climbing 3-5 steps with a railing? : None 6 Click Score: 24    End of Session Equipment Utilized During Treatment: Oxygen Activity Tolerance: Patient tolerated treatment well;No increased pain Patient left: in bed;with family/visitor present;with call bell/phone within reach Nurse Communication: Mobility status (HFNC cannot have extension tubing) PT Visit Diagnosis: Muscle weakness (generalized) (M62.81)    Time: 2355-7322 PT Time Calculation (min) (ACUTE ONLY): 18 min   Charges:   PT Evaluation $PT Eval Low Complexity: 1 Low         12:22 PM, 10/28/21 Etta Grandchild, PT, DPT Physical Therapist - [facility]  [phone] ([place])   Hearl Heikes C 10/28/2021, 12:19 PM

## 2021-10-28 NOTE — Progress Notes (Addendum)
Progress Note   Patient: Krystal Greene QIH:[id number] DOB: [birth year] DOA: 10/26/2021     3 DOS: the patient was seen and examined on 10/29/2021   Brief hospital course: 86 years old female with PMH significant for left upper lobe lung cancer(s/p radiation therapy in 2015) with recurrent stable left upper lobe mass, COPD on home oxygen at 3 L, regularly follows up with pulmonology, symptomatic moderate aortic stenosis and aortic insufficiency regularly follows with cardiology, dementia, hypertension, tobacco abuse presented in the ED with 3-week history of shortness of breath on exertion and wheezing.  Patient was significantly hypoxic on arrival with increased work of breathing.  SPO2 80% on her home oxygen level.  She was given DuoNeb, Solu-Medrol and magnesium in route.  She was tachypneic, tachycardic with SPO2 of 99% after she is being placed on BiPAP.  CTA chest shows bilateral pleural effusion with bibasilar consolidation.  Stable spiculated mass lesion in the left apex, no evidence of pulmonary embolism.  Patient is admitted for acute on chronic hypoxic and hypercapnic respiratory failure requiring BiPAP.  She is started on IV antibiotics and diuresis.  Cardiology consulted.  Patient underwent right-sided thoracentesis on 10/3, 1 L pleural fluid removed. 10/4: Creatinine increased from 0.64 up to 1.20, diuresis held off throughout worsening.   Assessment and Plan: * Acute on chronic respiratory failure with hypoxia and hypercapnia (HCC) Multifactorial, mostly to COPD exacerbation but also possible CAP, bilateral pleural effusions and possible CHF though no history of same At baseline wears 3 L and transiently required BiPAP in the ED for the BG showing pH 7.24 with PCO2 78 Treat acute issues as outlined Pulmonology consulted  10/4: On 10 L/min HFNC O2 10/5: On 6-8 L/min HFNC, ongoing episodes of distress, recurrent right pleural effusion.  COPD with acute exacerbation (HCC) Schedule  and as needed nebulized bronchodilator treatments, IV steroids Antitussives, flutter valve, incentive spirometer  Acute on chronic diastolic CHF (congestive heart failure) (HCC) Moderate aortic stenosis and aortic insufficiency IV Lasix Echocardiogram completed, report is pending Daily weights with intake and output monitoring Cardiology consult  CAP (community acquired pneumonia) Chest CT showing possible basilar consolidation Rocephin and azithromycin Other treatment as outlined above  SIRS (systemic inflammatory response syndrome) ([place]) Possible sepsis Patient with tachycardia, tachypnea, leukocytosis and lactic acidosis Suspect SIRS.  Suspect lactic acidosis secondary to respiratory distress Will hold off on sepsis fluids especially with diagnosis of possible CHF We will continue to monitor closely and initiate sepsis fluids if more convincing for actual sepsis  Bilateral pleural effusion Possibly related to pneumonia, lung mass, CHF Patient underwent right-sided thoracentesis on 10/3 with 1 L pleural fluid removed. -- Follow-up pleural fluid cultures and cytology -- Monitor respiratory status -- O2 per protocol -- Pulmonology follow-up outpatient  Hyponatremia Initial Na was 135 on admission >> 130 >> 132. Likely hypervolemic in setting of decompensated CHF. May have a component of SIADH given her lung mass as well. --Diuresis as renal function and BP allows --Monitor BMP --  Hyperkalemia K5.6 this morning, also with new AKI. Initial potassium was 4.6. Has been getting IV Lasix -- Monitor BMP -- AKI eval as outlined -- Lokelma 5 g x 1  AKI (acute kidney injury) ([place]) Creatinine on admission was normal 0.64, this morning elevated 1.20 after being kept n.p.o. for thoracentesis yesterday.  Also getting diuresis. -- Resume diet -- Held off Lasix 10/4 PM due to Cr rising --Cr improved, resume Lasix 40 IV daily given ongoing respiratory issues and recurrent pleural  effusion -- Monitor BMP daily --Renal ultrasound negative for obstruction. --Bladder scans to ensure emptying  Hypoglycemia Glucose on a.m. BMP was 54 this morning (10/4).  Due to poor p.o. intake while n.p.o. for procedure yesterday. --Resume diet.   --Hypoglycemia protocol.  Aortic stenosis Noted.  No acute issues.  History of primary non-small cell carcinoma of left lung Stable spiculated mass left apex Status post chemo about 8 years prior Patient is followed by pulmonology  Lung mass Chronic, noted to be stable on CTA chest on admission 10/3.  Follow-up with pulmonology. Recently underwent PET scan on 05/19/2021 which showed the left apical lesion to be enlarging with low-level metabolic activity possibly secondary to radiation fibrosis although progression cannot be excluded.  CT chest repeat recommended in 6 months.        Subjective: Patient seen with daughter and two granddaughters at bedside on rounds morning.  Pt mostly sleeping but wakes up to answer questions.  Family report ongoing episodes of severe dyspnea every time pt attempts to get up.  She has gasping SOB, then gets very anxious.  (Is not anxious before onset of difficulty breathing.).  Now her O2 sats seem to remain stable and she recovers a bit more quickly, but are still very distressing to patient.     Physical Exam: Vitals:   10/29/21 0407 10/29/21 0540 10/29/21 0743 10/29/21 0843  BP: 122/62   114/70  Pulse: 95   (!) 102  Resp: 16   20  Temp: 97.8 F (36.6 C)   98 F (36.7 C)  TempSrc:    Oral  SpO2: 96%  96% 96%  Weight:  43.2 kg    Height:       General exam: sleeping comfortably and responsive, no acute distress, frail HEENT: moist mucus membranes, hearing grossly normal, nasal cannula in place Respiratory system: CTAB with diminished bases, no wheezes, normal respiratory effort at rest on 7 L high flow nasal cannula O2. Cardiovascular system: normal S1/S2, RRR, + JVP, no pedal edema.    Central nervous system: A&O x2. no gross focal neurologic deficits, normal speech Extremities: moves all, no edema, normal tone Skin: dry, intact, normal temperature Psychiatry: normal mood, congruent affect   Data Reviewed:  Notable labs -- Na 130, Cl 89, BUN 44, BNP 2094.6 from 1015.5 two days ago,  Cr improved from 1.20 to 0.93 with holding off diuresis, WBC 17.1k (from 15k).  Hbg 11.5k  Family Communication: Daughter and granddaughters at bedside on rounds  Disposition: Status is: Inpatient Remains inpatient appropriate because: Severity of illness with ongoing oxygen requirement well above her baseline, ongoing spells of severe dyspnea with respiratory distress. Ongoing evaluation of same.    Planned Discharge Destination: Home    Time spent: 45 minutes  Author: Ezekiel Slocumb, DO 10/29/2021 9:33 AM  For on call review [email or web address].

## 2021-10-28 NOTE — Consult Note (Signed)
PULMONOLOGY         Date: 10/28/2021,   MRN# [id number] Krystal Greene Oct 01, 1935     AdmissionWeight: 45 kg                 CurrentWeight: 45 kg  Referring provider: Dr Arbutus Ped   CHIEF COMPLAINT:   Acute exacerbation of COPD    HISTORY OF PRESENT ILLNESS   86 years old female with PMH significant for left upper lobe lung cancer(s/p radiation therapy in 2015) Daughter states over past 3 weeks she did have worsening dyspnea but O2 has never been less then 88%.  She does use nebulizer. On arrival to hospital she had sPO2 noted to be 80%., with recurrent stable left upper lobe mass, COPD on home oxygen at 3 L, regularly follows up with pulmonology, symptomatic moderate aortic stenosis and aortic insufficiency regularly follows with cardiology, dementia, hypertension, tobacco abuse presented in the ED with 3-week history of shortness of breath on exertion and wheezing.  Patient was significantly hypoxic on arrival with increased work of breathing. .  CTA chest shows bilateral pleural effusion with bibasilar consolidation.  Stable spiculated mass lesion in the left apex, no evidence of pulmonary embolism.  She has palliative care following her due to poor prognosis. Thoracentesis was done with removal of 1L of fluid.    PAST MEDICAL HISTORY   Past Medical History:  Diagnosis Date   GERD (gastroesophageal reflux disease)    Lung cancer ([place])    Osteoporosis      SURGICAL HISTORY   Past Surgical History:  Procedure Laterality Date   BACK SURGERY     BREAST EXCISIONAL BIOPSY Right 30+ yrs ago?   neg   CERVICAL FUSION     ESOPHAGEAL DILATION       FAMILY HISTORY   Family History  Problem Relation Age of Onset   Kidney disease Sister      SOCIAL HISTORY   Social History   Tobacco Use   Smoking status: Every Day    Packs/day: 1.00    Types: Cigarettes   Smokeless tobacco: Never  Substance Use Topics   Alcohol use: Yes    Alcohol/week: 1.0 standard  drink of alcohol    Types: 1 Glasses of wine per week    Comment: nightly   Drug use: No     MEDICATIONS     Current Medication:  Current Facility-Administered Medications:    acetaminophen (TYLENOL) tablet 650 mg, 650 mg, Oral, Q6H PRN **OR** acetaminophen (TYLENOL) suppository 650 mg, 650 mg, Rectal, Q6H PRN, Athena Masse, MD   azithromycin (ZITHROMAX) 500 mg in sodium chloride 0.9 % 250 mL IVPB, 500 mg, Intravenous, Q0600, Foust, Katy L, NP, Last Rate: 250 mL/hr at 10/28/21 0634, 500 mg at 10/28/21 0634   cefTRIAXone (ROCEPHIN) 2 g in sodium chloride 0.9 % 100 mL IVPB, 2 g, Intravenous, Q0600, Foust, Katy L, NP, Last Rate: 200 mL/hr at 10/28/21 0546, 2 g at 10/28/21 0546   enoxaparin (LOVENOX) injection 30 mg, 30 mg, Subcutaneous, Q24H, Judd Gaudier V, MD, 30 mg at 10/28/21 0825   furosemide (LASIX) injection 40 mg, 40 mg, Intravenous, Daily, Tang, Lily Michelle, PA-C   guaiFENesin (MUCINEX) 12 hr tablet 600 mg, 600 mg, Oral, BID, Judd Gaudier V, MD, 600 mg at 10/28/21 0825   insulin aspart (novoLOG) injection 0-5 Units, 0-5 Units, Subcutaneous, QHS, Shawna Clamp, MD, 2 Units at 10/27/21 2148   insulin aspart (novoLOG) injection 0-9 Units, 0-9 Units,  Subcutaneous, TID WC, Shawna Clamp, MD, 1 Units at 10/27/21 1730   ipratropium-albuterol (DUONEB) 0.5-2.5 (3) MG/3ML nebulizer solution 3 mL, 3 mL, Nebulization, TID, Caryn Bee, MD, 3 mL at 10/28/21 0733   levalbuterol (XOPENEX) nebulizer solution 0.63 mg, 0.63 mg, Nebulization, Q4H PRN, Nicole Kindred A, DO   morphine (PF) 2 MG/ML injection 0.5-1 mg, 0.5-1 mg, Intravenous, Q4H PRN, Nicole Kindred A, DO   nicotine (NICODERM CQ - dosed in mg/24 hours) patch 21 mg, 21 mg, Transdermal, Daily, Damita Dunnings, Waldemar Dickens, MD   ondansetron (ZOFRAN) tablet 4 mg, 4 mg, Oral, Q6H PRN **OR** ondansetron (ZOFRAN) injection 4 mg, 4 mg, Intravenous, Q6H PRN, Athena Masse, MD   [COMPLETED] methylPREDNISolone sodium succinate (SOLU-MEDROL)  40 mg/mL injection 40 mg, 40 mg, Intravenous, Q12H, 40 mg at 10/26/21 1746 **FOLLOWED BY** predniSONE (DELTASONE) tablet 40 mg, 40 mg, Oral, Q breakfast, Athena Masse, MD, 40 mg at 10/28/21 0825    ALLERGIES   Hydrocodone     REVIEW OF SYSTEMS    Review of Systems:  Gen:  Denies  fever, sweats, chills weigh loss  HEENT: Denies blurred vision, double vision, ear pain, eye pain, hearing loss, nose bleeds, sore throat Cardiac:  No dizziness, chest pain or heaviness, chest tightness,edema Resp:   reports dyspnea chronically  Gi: Denies swallowing difficulty, stomach pain, nausea or vomiting, diarrhea, constipation, bowel incontinence Gu:  Denies bladder incontinence, burning urine Ext:   Denies Joint pain, stiffness or swelling Skin: Denies  skin rash, easy bruising or bleeding or hives Endoc:  Denies polyuria, polydipsia , polyphagia or weight change Psych:   Denies depression, insomnia or hallucinations   Other:  All other systems negative   VS: BP (!) 109/55 (BP Location: Left Arm)   Pulse (!) 105   Temp 98.5 F (36.9 C)   Resp 19   Ht '5\' 2"'  (1.575 m)   Wt 45 kg   SpO2 96%   BMI 18.15 kg/m      PHYSICAL EXAM    GENERAL:NAD, no fevers, chills, no weakness no fatigue HEAD: Normocephalic, atraumatic.  EYES: Pupils equal, round, reactive to light. Extraocular muscles intact. No scleral icterus.  MOUTH: Moist mucosal membrane. Dentition intact. No abscess noted.  EAR, NOSE, THROAT: Clear without exudates. No external lesions.  NECK: Supple. No thyromegaly. No nodules. No JVD.  PULMONARY: decreased breath sounds with mild rhonchi worse at bases bilaterally.  CARDIOVASCULAR: S1 and S2. Regular rate and rhythm. No murmurs, rubs, or gallops. No edema. Pedal pulses 2+ bilaterally.  GASTROINTESTINAL: Soft, nontender, nondistended. No masses. Positive bowel sounds. No hepatosplenomegaly.  MUSCULOSKELETAL: No swelling, clubbing, or edema. Range of motion full in all  extremities.  NEUROLOGIC: Cranial nerves II through XII are intact. No gross focal neurological deficits. Sensation intact. Reflexes intact.  SKIN: No ulceration, lesions, rashes, or cyanosis. Skin warm and dry. Turgor intact.  PSYCHIATRIC: Mood, affect within normal limits. The patient is awake, alert and oriented x 3. Insight, judgment intact.       IMAGING     ASSESSMENT/PLAN   Acute on chronic hypoxemic respiratory failure     - present on admission     - this is due to advanced COPD with severe end stage emphysema with advanced CHF and large pleural effusions with associated compressive atelectasis.  Patient still actively smokes and does not wish to have aggressive measures nor is healthy enough to endure them.  I met with family and we reviewed current hospital findings, labwork as  well as her clinical decline over past 2 years under my care on outpatient. The patient herself is in no distress and expresses desire to be home and have hospice evaluation.   - Family is agreeable to hospice at this time            Thank you for allowing me to participate in the care of this patient.   Patient/Family are satisfied with care plan and all questions have been answered.    Provider disclosure: Patient with at least one acute or chronic illness or injury that poses a threat to life or bodily function and is being managed actively during this encounter.  All of the below services have been performed independently by signing provider:  review of prior documentation from internal and or external health records.  Review of previous and current lab results.  Interview and comprehensive assessment during patient visit today. Review of current and previous chest radiographs/CT scans. Discussion of management and test interpretation with health care team and patient/family.   This document was prepared using Dragon voice recognition software and may include unintentional dictation  errors.     Ottie Glazier, M.D.  Division of Pulmonary & Critical Care Medicine

## 2021-10-28 NOTE — Progress Notes (Signed)
[facility]  CARDIOLOGY CONSULT NOTE  Patient ID: Krystal Greene MRN: [id number] DOB/AGE: 84-09-1935 84 y.o.  Admit date: 10/26/2021 Referring Physician Damita Dunnings Primary Physician [facility] Primary Cardiologist Nehemiah Massed Reason for Consultation congestive heart failure  HPI: 86 year old female referred for evaluation of congestive heart failure.  The patient has known history of COPD, on home O2, with history of left upper lobe lung cancer status post radiation therapy 2015, with recurrent stable left upper lobe lung mass followed by pulmonology.  Patient also has a history of moderate aortic stenosis, and chronic diastolic congestive heart failure.  She presents to [facility] ED with shortness of breath, and hypoxia with oxygen saturation in low 80s on 3 L of O2.  Patient treated with DuoNebs and IV Medrol, with overall clinical improvement.  CT chest reveals bilateral pleural effusions, with stable spiculated mass left apex.  Admission labs notable for normal high-sensitivity troponin (18, 25), and elevated BNP 1015.  White count elevated at 13,400.  Interval history: -Still has intermittent periods of dyspnea without hypoxia, although she worked well with physical therapy earlier this morning without difficulty or dyspnea. -Says she feels okay, denies shortness of breath, chest pain, or other complaints -Long discussion with daughter regarding medication management and her comorbid lung and heart disease.  Review of systems complete and found to be negative unless listed above     Past Medical History:  Diagnosis Date   GERD (gastroesophageal reflux disease)    Lung cancer ([place])    Osteoporosis     Past Surgical History:  Procedure Laterality Date   BACK SURGERY     BREAST EXCISIONAL BIOPSY Right 30+ yrs ago?   neg   CERVICAL FUSION     ESOPHAGEAL DILATION      Medications Prior to Admission  Medication Sig Dispense Refill Last Dose   ADVAIR DISKUS 250-50 MCG/ACT AEPB Inhale 1 puff  into the lungs 2 (two) times daily.   10/25/2021   arformoterol (BROVANA) 15 MCG/2ML NEBU Take 2 mLs by nebulization every 12 (twelve) hours.   10/25/2021   cefUROXime (CEFTIN) 250 MG tablet Take 250 mg by mouth 2 (two) times daily.   10/25/2021   ipratropium-albuterol (DUONEB) 0.5-2.5 (3) MG/3ML SOLN Take 3 mLs by nebulization 2 (two) times daily.   10/25/2021   modafinil (PROVIGIL) 100 MG tablet Take 100 mg by mouth daily.   10/25/2021   albuterol (VENTOLIN HFA) 108 (90 Base) MCG/ACT inhaler Inhale 2 puffs into the lungs every 6 (six) hours as needed for wheezing or shortness of breath. 8 g 2 prn at prn   alendronate (FOSAMAX) 70 MG tablet Take 70 mg by mouth every Sunday at 6pm.   10/24/2021   bisoprolol-hydrochlorothiazide (ZIAC) 5-6.25 MG tablet Take 1 tablet by mouth daily. (Patient not taking: Reported on 10/26/2021)  9 Not Taking   feeding supplement (ENSURE ENLIVE / ENSURE PLUS) LIQD Take 237 mLs by mouth 2 (two) times daily between meals. 237 mL 12    furosemide (LASIX) 20 MG tablet Take 20 mg by mouth daily.   10/24/2021   nicotine (NICODERM CQ - DOSED IN MG/24 HOURS) 21 mg/24hr patch Place 1 patch (21 mg total) onto the skin daily. 28 patch 0 prn at prn   spironolactone (ALDACTONE) 50 MG tablet Take 50 mg by mouth daily. (Patient not taking: Reported on 10/26/2021)   Not Taking    Social History   Socioeconomic History   Marital status: Married    Spouse name: Not on file  Number of children: Not on file   Years of education: Not on file   Highest education level: Not on file  Occupational History   Not on file  Tobacco Use   Smoking status: Every Day    Packs/day: 1.00    Types: Cigarettes   Smokeless tobacco: Never  Substance and Sexual Activity   Alcohol use: Yes    Alcohol/week: 1.0 standard drink of alcohol    Types: 1 Glasses of wine per week    Comment: nightly   Drug use: No   Sexual activity: Not on file  Other Topics Concern   Not on file  Social History Narrative    Not on file   Social Determinants of Health   Financial Resource Strain: Not on file  Food Insecurity: No Food Insecurity (10/26/2021)   Hunger Vital Sign    Worried About Running Out of Food in the Last Year: Never true    Ran Out of Food in the Last Year: Never true  Transportation Needs: No Transportation Needs (10/26/2021)   PRAPARE - Hydrologist (Medical): No    Lack of Transportation (Non-Medical): No  Physical Activity: Not on file  Stress: Not on file  Social Connections: Not on file  Intimate Partner Violence: Not At Risk (10/26/2021)   Humiliation, Afraid, Rape, and Kick questionnaire    Fear of Current or Ex-Partner: No    Emotionally Abused: No    Physically Abused: No    Sexually Abused: No    Family History  Problem Relation Age of Onset   Kidney disease Sister      PHYSICAL EXAM General: Elderly and thin Caucasian female, in no acute distress.  Sitting upright in hospital bed with daughter at bedside HEENT:  Normocephalic and atraumatic. Neck:   + JVD.  Lungs: Normal respiratory effort on oxygen by nasal cannula.  Decreased breath sounds bilaterally with trace expiratory wheezes, cough with deep inspiration with rhonchi present Heart: Tachycardic but regular. Normal S1 and S2 without gallops no appreciable murmurs. Radial and DP pulses 2+ bilaterally. Abdomen: non-distended appearing.  Msk: Normal strength and tone for age. Extremities: No clubbing, cyanosis, edema.  Neuro: Alert and oriented x3 Psych:  mood appropriate for situation.    Labs:   Lab Results  Component Value Date   WBC 17.1 (H) 10/28/2021   HGB 11.5 (L) 10/28/2021   HCT 34.9 (L) 10/28/2021   MCV 92.3 10/28/2021   PLT 305 10/28/2021    Recent Labs  Lab 10/26/21 0043 10/26/21 0508 10/28/21 0537  NA 131*   < > 130*  K 4.6   < > 4.6  CL 92*   < > 89*  CO2 31   < > 31  BUN 25*   < > 44*  CREATININE 0.64   < > 0.93  CALCIUM 8.8*   < > 8.3*  PROT 7.2   --   --   BILITOT 0.5  --   --   ALKPHOS 69  --   --   ALT 18  --   --   AST 28  --   --   GLUCOSE 215*   < > 96   < > = values in this interval not displayed.    Lab Results  Component Value Date   TROPONINI <0.03 06/30/2014    Lab Results  Component Value Date   CHOL 157 02/04/2021   Lab Results  Component Value Date   HDL  61 02/04/2021   Lab Results  Component Value Date   LDLCALC 84 02/04/2021   Lab Results  Component Value Date   TRIG 61 02/04/2021   Lab Results  Component Value Date   CHOLHDL 2.6 02/04/2021   No results found for: "LDLDIRECT"    Radiology: US RENAL  Result Date: 10/27/2021 CLINICAL DATA:  Acute kidney injury EXAM: RENAL / URINARY TRACT ULTRASOUND COMPLETE COMPARISON:  PET-CT 05/19/2021 FINDINGS: Right Kidney: Renal measurements: 10.6 x 4.5 x 4.4 cm = volume: 110 mL. Echogenicity within normal limits. No hydronephrosis. There is a 1.0 x 0.6 x 0.8 cm simple cyst in the mid kidney. Left Kidney: Renal measurements: 10.3 x 4.5 x 4.6 cm = volume: 111 mL. Echogenicity within normal limits. There is no hydronephrosis. There is a 4.6 x 2.7 x 2.4 cm cyst in the lower pole. There is a 1.3 x 1.0 x 1.2 cm cyst in the superior pole. Bladder: Appears normal for degree of bladder distention. Other: None. IMPRESSION: No hydronephrosis. Bilateral renal cysts. Electronically Signed   By: Ronney Asters M.D.   On: 10/27/2021 16:27   DG Chest Port 1 View  Result Date: 10/26/2021 CLINICAL DATA:  Ultrasound guided right thoracentesis EXAM: PORTABLE CHEST 1 VIEW COMPARISON:  10/26/2021 FINDINGS: Single frontal view of the chest demonstrates a stable cardiac silhouette. Extensive calcification of the mitral annulus. Decreased right pleural effusion after interval thoracentesis. No evidence of pneumothorax. Stable spiculated left apical mass. Persistent left basilar consolidation and left pleural effusion. No acute bony abnormalities. IMPRESSION: 1. No complication after right  thoracentesis, with complete resolution of the right pleural effusion seen previously. 2. Persistent left basilar consolidation and effusion. 3. Stable spiculated left apical mass. Electronically Signed   By: Randa Ngo M.D.   On: 10/26/2021 16:03   US THORACENTESIS ASP PLEURAL SPACE W/IMG GUIDE  Result Date: 10/26/2021 INDICATION: Pleural effusion, shortness of breath EXAM: ULTRASOUND GUIDED RIGHT THORACENTESIS MEDICATIONS: None. COMPLICATIONS: None immediate. PROCEDURE: An ultrasound guided thoracentesis was thoroughly discussed with the patient and questions answered. The benefits, risks, alternatives and complications were also discussed. The patient understands and wishes to proceed with the procedure. Written consent was obtained. Ultrasound was performed to localize and mark an adequate pocket of fluid in the right chest. The area was then prepped and draped in the normal sterile fashion. 1% Lidocaine was used for local anesthesia. Under ultrasound guidance a 6 Fr Safe-T-Centesis catheter was introduced. Thoracentesis was performed. The catheter was removed and a dressing applied. FINDINGS: A total of approximately 1L of thin red pleural fluid was removed. Samples were sent to the laboratory as requested by the clinical team. IMPRESSION: Successful ultrasound guided right thoracentesis yielding 1L of pleural fluid. Performed and dictated by Pasty Spillers, PA-C Electronically Signed   By: Albin Felling M.D.   On: 10/26/2021 15:49   CT Angio Chest PE W and/or Wo Contrast  Result Date: 10/26/2021 CLINICAL DATA:  Shortness of breath for 2 days, initial encounter EXAM: CT ANGIOGRAPHY CHEST WITH CONTRAST TECHNIQUE: Multidetector CT imaging of the chest was performed using the standard protocol during bolus administration of intravenous contrast. Multiplanar CT image reconstructions and MIPs were obtained to evaluate the vascular anatomy. RADIATION DOSE REDUCTION: This exam was performed according to  the departmental dose-optimization program which includes automated exposure control, adjustment of the mA and/or kV according to patient size and/or use of iterative reconstruction technique. CONTRAST:  57mL OMNIPAQUE IOHEXOL 350 MG/ML SOLN COMPARISON:  Chest x-ray from earlier in  the same day. FINDINGS: Cardiovascular: Thoracic aorta and its branches demonstrate atherosclerotic calcifications. No aneurysmal dilatation is seen. The pulmonary artery shows a normal branching pattern bilaterally. No filling defect to suggest pulmonary embolism is seen. No cardiac enlargement is noted. Coronary calcifications are seen. Mediastinum/Nodes: Thoracic inlet is within normal limits. No hilar or mediastinal adenopathy is noted. The esophagus is within normal limits. Lungs/Pleura: Lungs demonstrate evidence of a large area of spiculated architectural distortion in the left apex stable from the prior exam. Bilateral pleural effusions are noted right considerably greater than left with associated lower lobe consolidation. Mild middle lobe atelectatic changes are noted as well. Upper Abdomen: No acute abnormality. Musculoskeletal: Degenerative changes of the thoracic spine are noted. No acute rib fracture is noted. Stable sclerosis in the posterior aspect of the right third rib is seen. Chronic compression deformity is noted at T9 stable in appearance from the prior study. Review of the MIP images confirms the above findings. IMPRESSION: Bilateral pleural effusions with basilar consolidation as described. Stable spiculated mass lesion in the left apex. No evidence of pulmonary emboli. Electronically Signed   By: Inez Catalina M.D.   On: 10/26/2021 03:07   DG Chest Portable 1 View  Result Date: 10/26/2021 CLINICAL DATA:  Shortness of breath. EXAM: PORTABLE CHEST 1 VIEW COMPARISON:  February 04, 2021 FINDINGS: The cardiac silhouette is mildly enlarged. There is a stable left apical mass. Stable, diffuse, chronic appearing  increased interstitial lung markings are also noted. Moderate severity right basilar atelectasis and/or infiltrate is seen with mild left basilar atelectasis. There are small bilateral pleural effusions. No pneumothorax is identified. The visualized skeletal structures are unremarkable. IMPRESSION: 1. Stable left apical mass. 2. Moderate severity right basilar atelectasis and/or infiltrate with mild left basilar atelectasis. 3. Small bilateral pleural effusions. Electronically Signed   By: Virgina Norfolk M.D.   On: 10/26/2021 01:03    EKG: Sinus tachycardia at 123 bpm, with LVH with repolarization abnormality  Data reviewed by me: Hospitalist progress note, admission H&P, CBC, BMP, BNP troponins, procalcitonin, vitals, telemetry  ASSESSMENT AND PLAN:   1.  Acute on chronic diastolic congestive heart failure, with elevated BNP, improved after IV furosemide 40x1, 20 mg x 3 (on home Lasix 20 mg every other day) 2.  Moderate aortic stenosis and aortic insufficiency 3.  Acute on chronic respiratory failure, with history of underlying COPD, on chronic O2, improved after nebulizer treatment and IV Solu-Medrol 4.  Community-acquired pneumonia 5.  Bilateral pleural effusion 6.  History of primary non-small cell carcinoma left lung with stable spiculated mass left apex 7.  Dementia  Recommendations  1.  Agree with current therapy 2.  Resume IV Lasix 40 mg x 2 more doses, then likely resume 40 mg p.o. Lasix once daily at discharge until follow-up with outpatient cardiology or pulmonology  3.  Carefully monitor renal status 4.  2D echocardiogram, pending read  This patient's plan of care was discussed and created with Dr. Saralyn Pilar and he is in agreement.    Signed: Tristan Schroeder, PA-C 10/28/2021, 12:03 PM

## 2021-10-29 DIAGNOSIS — Z7189 Other specified counseling: Secondary | ICD-10-CM | POA: Diagnosis not present

## 2021-10-29 DIAGNOSIS — Z515 Encounter for palliative care: Secondary | ICD-10-CM

## 2021-10-29 DIAGNOSIS — E871 Hypo-osmolality and hyponatremia: Secondary | ICD-10-CM | POA: Diagnosis not present

## 2021-10-29 DIAGNOSIS — J9621 Acute and chronic respiratory failure with hypoxia: Secondary | ICD-10-CM | POA: Diagnosis not present

## 2021-10-29 DIAGNOSIS — J9622 Acute and chronic respiratory failure with hypercapnia: Secondary | ICD-10-CM | POA: Diagnosis not present

## 2021-10-29 LAB — CBC
HCT: 34.7 % — ABNORMAL LOW (ref 36.0–46.0)
Hemoglobin: 11.3 g/dL — ABNORMAL LOW (ref 12.0–15.0)
MCH: 30.4 pg (ref 26.0–34.0)
MCHC: 32.6 g/dL (ref 30.0–36.0)
MCV: 93.3 fL (ref 80.0–100.0)
Platelets: 257 10*3/uL (ref 150–400)
RBC: 3.72 MIL/uL — ABNORMAL LOW (ref 3.87–5.11)
RDW: 13 % (ref 11.5–15.5)
WBC: 13.4 10*3/uL — ABNORMAL HIGH (ref 4.0–10.5)
nRBC: 0 % (ref 0.0–0.2)

## 2021-10-29 LAB — MAGNESIUM: Magnesium: 2 mg/dL (ref 1.7–2.4)

## 2021-10-29 LAB — BASIC METABOLIC PANEL
Anion gap: 5 (ref 5–15)
BUN: 43 mg/dL — ABNORMAL HIGH (ref 8–23)
CO2: 34 mmol/L — ABNORMAL HIGH (ref 22–32)
Calcium: 8.3 mg/dL — ABNORMAL LOW (ref 8.9–10.3)
Chloride: 93 mmol/L — ABNORMAL LOW (ref 98–111)
Creatinine, Ser: 0.79 mg/dL (ref 0.44–1.00)
GFR, Estimated: >60 mL/min (ref >60)
Glucose, Bld: 119 mg/dL — ABNORMAL HIGH (ref 70–99)
Potassium: 4.3 mmol/L (ref 3.5–5.1)
Sodium: 132 mmol/L — ABNORMAL LOW (ref 135–145)

## 2021-10-29 LAB — GLUCOSE, CAPILLARY
Glucose-Capillary: 140 mg/dL — ABNORMAL HIGH (ref 70–99)
Glucose-Capillary: 256 mg/dL — ABNORMAL HIGH (ref 70–99)

## 2021-10-29 MED ORDER — MORPHINE SULFATE (CONCENTRATE) 20 MG/ML PO SOLN: 5.0000 mg | ORAL | 0 refills | Status: DC | PRN

## 2021-10-29 MED ORDER — AMOXICILLIN-POT CLAVULANATE 875-125 MG PO TABS: 1.0000 | ORAL_TABLET | Freq: Two times a day (BID) | ORAL | 0 refills | Status: AC

## 2021-10-29 MED ORDER — LIDOCAINE 5 % EX PTCH: 1.0000 | MEDICATED_PATCH | CUTANEOUS | 0 refills | Status: DC

## 2021-10-29 MED ORDER — PREDNISONE 10 MG PO TABS: ORAL_TABLET | ORAL | 0 refills | Status: AC

## 2021-10-29 MED ORDER — ACETAMINOPHEN 325 MG PO TABS: 650.0000 mg | ORAL_TABLET | Freq: Four times a day (QID) | ORAL | Status: DC | PRN

## 2021-10-29 MED ORDER — LIDOCAINE 5 % EX PTCH
1.0000 | MEDICATED_PATCH | CUTANEOUS | Status: DC
  Administered 2021-10-29: 1 via TRANSDERMAL
  Filled 2021-10-29: qty 1

## 2021-10-29 NOTE — Progress Notes (Signed)
Manufacturing engineer [facility]) Hospital Liaison note:  Referral received for patient and family interest in [facility] services at home.  Writer met at beside with patient and her granddaughter Vicente Males and also spoke via telephone to patient's daughter  Malachy Mood to initiate education regarding hospice services, philosophy and team approach to care with understanding voiced. Questions answered. DME needs discussed.  Patient currently has 5 liter concentrator at home.  Family is requesting the following DME: Shower chair BSC 10 liter oxygen concentrator. DME has been requested for delivery today, but does not need to be in place prior to discharge per family.  Family plans for patient to discharge home via car.today. Vicente Males was made aware that she would need to bring oxygen from home for transport.  Hospital care team updated via Epic chat.  Thank you for this referral.  Flo Shanks BSN, RN, [facility] [phone]

## 2021-10-29 NOTE — Progress Notes (Signed)
Occupational Therapy Treatment Patient Details Name: Krystal Greene MRN: [id number] DOB: 28-Dec-1935 Today's Date: 10/29/2021   History of present illness Krystal Greene is an 89yoF who [facility] c CC dyspnea, hyoxia, workup revealing of BN 1000s, elevated white count. PMH: COPD on O2, [place] s/p radiation 2015, AS, dCHF.   OT comments  Pt seen for OT tx. Pt on 4L O2 throughout session. Pt completed ADL transfers from EOB and toilet with mod indep/supv for safety. Pt ambulated in room without AD 30'+30' with brief standing rest break. Pt/family educated in PRE scale and importance of activity pacing and use of strategies rest breaks to maximize safety and indep while minimizing risk of SOB/over exertion and "panic episodes". Pt/family verbalized understanding. On 4L O2 throughout session, SpO2 88-93% HR 102-117 with exertion, PRN VC for standing/sitting rest breaks and PLB. Pt continues to benefit from skilled OT services while hospitalized.    Recommendations for follow up therapy are one component of a multi-disciplinary discharge planning process, led by the attending physician.  Recommendations may be updated based on patient status, additional functional criteria and insurance authorization.    Follow Up Recommendations  No OT follow up    Assistance Recommended at Discharge Intermittent Supervision/Assistance  Patient can return home with the following  Assistance with cooking/housework;Assist for transportation;Help with stairs or ramp for entrance;A little help with bathing/dressing/bathroom   Equipment Recommendations  Tub/shower seat    Recommendations for Other Services      Precautions / Restrictions Precautions Precautions: Fall Precaution Comments: *pt permitted to AMB to BR with O2 tubing alone Restrictions Weight Bearing Restrictions: No       Mobility Bed Mobility Overal bed mobility: Modified Independent                   Transfers Overall transfer level: Needs assistance Equipment used: None Transfers: Sit to/from Stand Sit to Stand: Modified independent (Device/Increase time)                 Balance Overall balance assessment: Mild deficits observed, not formally tested                                         ADL either performed or assessed with clinical judgement   ADL                                         General ADL Comments: Set up for seated LB dressing EOB, supv for toilet transfer and standing pericare, standing grooming.    Extremity/Trunk Assessment              Vision       Perception     Praxis      Cognition Arousal/Alertness: Awake/alert Behavior During Therapy: WFL for tasks assessed/performed Overall Cognitive Status: Within Functional Limits for tasks assessed                                          Exercises Other Exercises Other Exercises: Pt/family educated in PRE scale and importance of activity pacing and use of strategies rest breaks to maximize safety and indep while minimizing risk of SOB/over exertion and "panic episodes"  Other Exercises: Pt ambulated in the room 30'+30' with brief standing rest break    Shoulder Instructions       General Comments On 4L O2 throughout session, SpO2 88-93% HR 102-117 with exertion, PRN VC for standing/sitting rest breaks and PLB.     Pertinent Vitals/ Pain       Pain Assessment Pain Assessment: No/denies pain  Home Living                                          Prior Functioning/Environment              Frequency  Min 2X/week        Progress Toward Goals  OT Goals(current goals can now be found in the care plan section)  Progress towards OT goals: Progressing toward goals  Acute Rehab OT Goals Patient Stated Goal: breathe better OT Goal Formulation: With patient/family Time For Goal Achievement: 11/11/21 Potential  to Achieve Goals: Good  Plan Discharge plan remains appropriate;Frequency remains appropriate    Co-evaluation                 AM-PAC OT "6 Clicks" Daily Activity     Outcome Measure   Help from another person eating meals?: None Help from another person taking care of personal grooming?: None Help from another person toileting, which includes using toliet, bedpan, or urinal?: None Help from another person bathing (including washing, rinsing, drying)?: A Little Help from another person to put on and taking off regular upper body clothing?: None Help from another person to put on and taking off regular lower body clothing?: A Little 6 Click Score: 22    End of Session Equipment Utilized During Treatment: Gait belt;Oxygen  OT Visit Diagnosis: Other abnormalities of gait and mobility (R26.89)   Activity Tolerance Patient tolerated treatment well   Patient Left in bed;with call bell/phone within reach;with family/visitor present   Nurse Communication          Time: 4332-9518 OT Time Calculation (min): 19 min  Charges: OT General Charges $OT Visit: 1 Visit OT Treatments $Self Care/Home Management : 8-22 mins  Ardeth Perfect., MPH, MS, OTR/L ascom [phone] 10/29/21, 1:36 PM

## 2021-10-29 NOTE — Progress Notes (Signed)
Daily Progress Note   Patient Name: Krystal Greene       Date: 10/29/2021 DOB: [birth year]  Age: 86 y.o. MRN#: [id number] Attending Physician: Ezekiel Slocumb, DO Primary Care Physician: Idelle Crouch, MD Admit Date: 10/26/2021  Reason for Consultation/Follow-up: Establishing goals of care  Subjective: Notes reviewed.  Into see patient who is sitting in bed.  Family stepped out for [place] to speak as daughter and I discussed yesterday.  Patient discusses that she lives at home with 24/7 care.  She tells me that she pays a lot of money for this assistance but really wants to be at home.  She states she uses no assistive devices at baseline.  She goes outside to smoke.  She states she has 3 children in addition to grandchildren and great-grandchildren.  She states her family prompts her to do things and sometimes she "baulks at this", but over time will eventually do what she is asked.  She states she has made some bad decisions in her life and understands that sometimes their decisions are good, even if it means doing things she does not want to do.   We discussed her diagnoses, prognosis, GOC, EOL wishes disposition and options.  Created space and opportunity for patient  to explore thoughts and feelings regarding current medical information.   A detailed discussion was had today regarding advanced directives.  Concepts specific to code status, artifical feeding and hydration, IV antibiotics and rehospitalization were discussed.  The difference between an aggressive medical intervention path and a comfort care path was discussed.  Values and goals of care important to patient and family were attempted to be elicited.  Discussed patient autonomy.  Discussed limitations of medical interventions  to prolong quality of life in some situations and discussed the concept of human mortality.  She tells me that feelings on care that she would or would not want can change when you are confronted with the prospect of death, and it is hard to know in advance what you would do.  She confirms that she would not want CPR.  She states that she would be amenable to being placed on a ventilator for a few days to a week to see how things go.  She states that she would want to return to the hospital to treat the  treatable.  She states she is a woman of faith, and she states she prays every day.  Discussed prayer for wisdom and discernment.  Nursing entered with discharge packet, and left it at bedside to return later.    Discussed continuing conversations at home.  Length of Stay: 3  Current Medications: Scheduled Meds:   enoxaparin (LOVENOX) injection  30 mg Subcutaneous Q24H   guaiFENesin  600 mg Oral BID   insulin aspart  0-5 Units Subcutaneous QHS   insulin aspart  0-9 Units Subcutaneous TID WC   ipratropium-albuterol  3 mL Nebulization TID   lidocaine  1 patch Transdermal Q24H   nicotine  21 mg Transdermal Daily   predniSONE  40 mg Oral Q breakfast    Continuous Infusions:  azithromycin (ZITHROMAX) 500 mg in sodium chloride 0.9 % 250 mL IVPB 500 mg (10/29/21 0617)   cefTRIAXone (ROCEPHIN)  IV 2 g (10/29/21 0515)    PRN Meds: acetaminophen **OR** acetaminophen, levalbuterol, morphine injection, ondansetron **OR** ondansetron (ZOFRAN) IV  Physical Exam Pulmonary:     Effort: Pulmonary effort is normal.  Neurological:     Mental Status: She is alert.             Vital Signs: BP 114/70   Pulse (!) 102   Temp 98 F (36.7 C) (Oral)   Resp 20   Ht 5\' 2"  (1.575 m)   Wt 43.2 kg   SpO2 93%   BMI 17.42 kg/m  SpO2: SpO2: 93 % O2 Device: O2 Device: Nasal Cannula O2 Flow Rate: O2 Flow Rate (L/min): 4 L/min  Intake/output summary:  Intake/Output Summary (Last 24 hours) at 10/29/2021  1422 Last data filed at 10/29/2021 0845 Gross per 24 hour  Intake 240 ml  Output 400 ml  Net -160 ml   LBM: Last BM Date : 10/25/21 Baseline Weight: Weight: 45 kg Most recent weight: Weight: 43.2 kg     Patient Active Problem List   Diagnosis Date Noted   Hyponatremia 10/29/2021   Hospice care patient 10/29/2021   Hypoglycemia 10/27/2021   AKI (acute kidney injury) ([place]) 10/27/2021   Hyperkalemia 10/27/2021   Acute on chronic respiratory failure with hypoxia and hypercapnia ([place]) 10/26/2021   COPD with acute exacerbation ([place]) 10/26/2021   History of primary non-small cell carcinoma of left lung 10/26/2021   SIRS (systemic inflammatory response syndrome) ([place]) 10/26/2021   Aortic stenosis 10/26/2021   Acute on chronic diastolic CHF (congestive heart failure) ([place]) 10/26/2021   Bilateral pleural effusion    Lung mass    CAP (community acquired pneumonia) 02/03/2021   Right leg swelling 02/03/2021   Renal lesion 02/03/2021   Protein-calorie malnutrition, moderate ([place]) 02/03/2021   GERD (gastroesophageal reflux disease)    Lung cancer (HCC)    HTN (hypertension)    Tobacco use    Sepsis ([place])    Elevated troponin    Acute respiratory failure with hypoxia ([place]) 03/31/2017   Acute respiratory failure with hypoxemia ([place]) 03/30/2017    Palliative Care Assessment & Plan   Recommendations/Plan: Recommend further conversations out patient.  Code Status:    Code Status Orders  (From admission, onward)           Start     Ordered   10/26/21 0456  Do not attempt resuscitation (DNR)  Continuous       Question Answer Comment  In the event of cardiac or respiratory ARREST Do not call a "code blue"   In the event of cardiac or  respiratory ARREST Do not perform Intubation, CPR, defibrillation or ACLS   In the event of cardiac or respiratory ARREST Use medication by any route, position, wound care, and other measures to relive pain and suffering. May use oxygen, suction  and manual treatment of airway obstruction as needed for comfort.      10/26/21 0456           Code Status History     Date Active Date Inactive Code Status Order ID Comments User Context   02/03/2021 1118 02/05/2021 2017 DNR [id number]  Ivor Costa, MD ED   02/03/2021 0843 02/03/2021 1118 Full Code [id number]  Ivor Costa, MD ED   03/30/2017 1222 04/01/2017 1721 Full Code [id number]  Epifanio Lesches, MD ED      Advance Directive Documentation    [place] Most Recent Value  Type of Advance Directive Out of facility DNR (pink MOST or yellow form)  Pre-existing out of facility DNR order (yellow form or pink MOST form) --  "MOST" Form in Place? --       Thank you for allowing the Palliative Medicine Team to assist in the care of this patient.   Asencion Gowda, NP  Please contact Palliative Medicine Team phone at [phone] for questions and concerns.

## 2021-10-29 NOTE — Assessment & Plan Note (Signed)
Initial Na was 135 on admission >> 130 >> 132. Likely hypervolemic in setting of decompensated CHF. May have a component of SIADH given her lung mass as well. --Diuresis as renal function and BP allows --Monitor BMP --

## 2021-10-29 NOTE — Consult Note (Signed)
Consultation Note Date: 10/29/2021   Patient Name: Krystal Greene  DOB: 29-Aug-1935  MRN: [id number]  Age / Sex: 86 y.o., female  PCP: Idelle Crouch, MD Referring Physician: Ezekiel Slocumb, DO  Reason for Consultation: Establishing goals of care  HPI/Patient Profile: 86 years old female with PMH significant for left upper lobe lung cancer(s/p radiation therapy in 2015) with recurrent stable left upper lobe mass, COPD on home oxygen at 3 L, regularly follows up with pulmonology, symptomatic moderate aortic stenosis and aortic insufficiency regularly follows with cardiology, dementia, hypertension, tobacco abuse presented in the ED with 3-week history of shortness of breath on exertion and wheezing.  Patient was significantly hypoxic on arrival with increased work of breathing.  SPO2 80% on her home oxygen level.  She was given DuoNeb, Solu-Medrol and magnesium in route.  She was tachypneic, tachycardic with SPO2 of 99% after she is being placed on BiPAP.  CTA chest shows bilateral pleural effusion with bibasilar consolidation.  Stable spiculated mass lesion in the left apex, no evidence of pulmonary embolism.  Patient is admitted for acute on chronic hypoxic and hypercapnic respiratory failure requiring BiPAP.  She is started on IV antibiotics and diuresis.  Clinical Assessment and Goals of Care: Patient is resting in bed visiting with family.  Patient states she feels a bit better than she has been.  Daughter stepped out to speak with me. She states she is HPOA for her mother. She states she stays with her mother on the weekends.  She states one of her siblings stays at night, and there is another sibling that also helps out.  She states they have paid caregivers that are there during the day so that someone is present 24 hours a day due to cognitive function.  She states that her mother can sometimes cook with  supervision and is able to clean with supervision as well.  She states her mother wears oxygen at baseline and goes outside to smoke.    She states that she would like to honor her mother's wishes what ever they may be on care moving forward.  She states that her mother is a person of faith.  Discussed that I would come back tomorrow morning and speak to her mother personally.    SUMMARY OF RECOMMENDATIONS    We will return tomorrow morning to speak with patient.      Primary Diagnoses: Present on Admission:  Acute on chronic respiratory failure with hypoxia and hypercapnia (HCC)  Lung mass  CAP (community acquired pneumonia)  Acute on chronic diastolic CHF (congestive heart failure) ([place])   I have reviewed the medical record, interviewed the patient and family, and examined the patient. The following aspects are pertinent.  Past Medical History:  Diagnosis Date   GERD (gastroesophageal reflux disease)    Lung cancer ([place])    Osteoporosis    Social History   Socioeconomic History   Marital status: Married    Spouse name: Not on file   Number of children: Not  on file   Years of education: Not on file   Highest education level: Not on file  Occupational History   Not on file  Tobacco Use   Smoking status: Every Day    Packs/day: 1.00    Types: Cigarettes   Smokeless tobacco: Never  Substance and Sexual Activity   Alcohol use: Yes    Alcohol/week: 1.0 standard drink of alcohol    Types: 1 Glasses of wine per week    Comment: nightly   Drug use: No   Sexual activity: Not on file  Other Topics Concern   Not on file  Social History Narrative   Not on file   Social Determinants of Health   Financial Resource Strain: Not on file  Food Insecurity: No Food Insecurity (10/26/2021)   Hunger Vital Sign    Worried About Running Out of Food in the Last Year: Never true    Ran Out of Food in the Last Year: Never true  Transportation Needs: No Transportation Needs  (10/26/2021)   PRAPARE - Hydrologist (Medical): No    Lack of Transportation (Non-Medical): No  Physical Activity: Not on file  Stress: Not on file  Social Connections: Not on file   Family History  Problem Relation Age of Onset   Kidney disease Sister    Scheduled Meds:  enoxaparin (LOVENOX) injection  30 mg Subcutaneous Q24H   guaiFENesin  600 mg Oral BID   insulin aspart  0-5 Units Subcutaneous QHS   insulin aspart  0-9 Units Subcutaneous TID WC   ipratropium-albuterol  3 mL Nebulization TID   lidocaine  1 patch Transdermal Q24H   nicotine  21 mg Transdermal Daily   predniSONE  40 mg Oral Q breakfast   Continuous Infusions:  azithromycin (ZITHROMAX) 500 mg in sodium chloride 0.9 % 250 mL IVPB 500 mg (10/29/21 0617)   cefTRIAXone (ROCEPHIN)  IV 2 g (10/29/21 0515)   PRN Meds:.acetaminophen **OR** acetaminophen, levalbuterol, morphine injection, ondansetron **OR** ondansetron (ZOFRAN) IV Medications Prior to Admission:  Prior to Admission medications   Medication Sig Start Date End Date Taking? Authorizing Provider  ADVAIR DISKUS 250-50 MCG/ACT AEPB Inhale 1 puff into the lungs 2 (two) times daily. 02/01/21  Yes [provider]  arformoterol (BROVANA) 15 MCG/2ML NEBU Take 2 mLs by nebulization every 12 (twelve) hours. 08/26/21  Yes [provider]  cefUROXime (CEFTIN) 250 MG tablet Take 250 mg by mouth 2 (two) times daily. 10/24/21  Yes [provider]  ipratropium-albuterol (DUONEB) 0.5-2.5 (3) MG/3ML SOLN Take 3 mLs by nebulization 2 (two) times daily. 10/22/21  Yes [provider]  modafinil (PROVIGIL) 100 MG tablet Take 100 mg by mouth daily. 10/22/21  Yes [provider]  albuterol (VENTOLIN HFA) 108 (90 Base) MCG/ACT inhaler Inhale 2 puffs into the lungs every 6 (six) hours as needed for wheezing or shortness of breath. 02/05/21   Fritzi Mandes, MD  alendronate (FOSAMAX) 70 MG tablet Take 70 mg by mouth every  Sunday at 6pm. 12/05/15   [provider]  bisoprolol-hydrochlorothiazide (ZIAC) 5-6.25 MG tablet Take 1 tablet by mouth daily. Patient not taking: Reported on 10/26/2021 03/02/17   [provider]  feeding supplement (ENSURE ENLIVE / ENSURE PLUS) LIQD Take 237 mLs by mouth 2 (two) times daily between meals. 02/05/21   Fritzi Mandes, MD  furosemide (LASIX) 20 MG tablet Take 20 mg by mouth daily. 08/26/21   [provider]  nicotine (NICODERM CQ -  DOSED IN MG/24 HOURS) 21 mg/24hr patch Place 1 patch (21 mg total) onto the skin daily. 02/06/21   Fritzi Mandes, MD  spironolactone (ALDACTONE) 50 MG tablet Take 50 mg by mouth daily. Patient not taking: Reported on 10/26/2021 05/04/21   [provider]   Allergies  Allergen Reactions   Hydrocodone Other (See Comments)    Syncope    Review of Systems  All other systems reviewed and are negative.   Physical Exam Pulmonary:     Effort: Pulmonary effort is normal.  Neurological:     Mental Status: She is alert.     Vital Signs: BP 114/70   Pulse (!) 102   Temp 98 F (36.7 C) (Oral)   Resp 20   Ht 5\' 2"  (1.575 m)   Wt 43.2 kg   SpO2 96%   BMI 17.42 kg/m  Pain Scale: 0-10 POSS *See Group Information*: S-Acceptable,Sleep, easy to arouse Pain Score: Asleep   SpO2: SpO2: 96 % O2 Device:SpO2: 96 % O2 Flow Rate: .O2 Flow Rate (L/min): 4 L/min  IO: Intake/output summary:  Intake/Output Summary (Last 24 hours) at 10/29/2021 0940 Last data filed at 10/28/2021 1540 Gross per 24 hour  Intake 360 ml  Output --  Net 360 ml    LBM: Last BM Date : 10/25/21 Baseline Weight: Weight: 45 kg Most recent weight: Weight: 43.2 kg      Signed by: Asencion Gowda, NP   Please contact Palliative Medicine Team phone at [phone] for questions and concerns.  For individual provider: See Shea Evans

## 2021-10-29 NOTE — Progress Notes (Signed)
[facility] Cardiology  SUBJECTIVE: Patient laying in bed, reports feeling much better, denies chest pain or shortness of breath   Vitals:   10/29/21 0540 10/29/21 0743 10/29/21 0843 10/29/21 1411  BP:   114/70   Pulse:   (!) 102   Resp:   20   Temp:   98 F (36.7 C)   TempSrc:   Oral   SpO2:  96% 96% 93%  Weight: 43.2 kg     Height:         Intake/Output Summary (Last 24 hours) at 10/29/2021 1432 Last data filed at 10/29/2021 0845 Gross per 24 hour  Intake 240 ml  Output 400 ml  Net -160 ml      PHYSICAL EXAM  General: Well developed, well nourished, in no acute distress HEENT:  Normocephalic and atramatic Neck:  No JVD.  Lungs: Clear bilaterally to auscultation and percussion. Heart: HRRR . Normal S1 and S2 without gallops or murmurs.  Abdomen: Bowel sounds are positive, abdomen soft and non-tender  Msk:  Back normal, normal gait. Normal strength and tone for age. Extremities: No clubbing, cyanosis or edema.   Neuro: Alert and oriented X 3. Psych:  Good affect, responds appropriately   LABS: Basic Metabolic Panel: Recent Labs    10/28/21 0537 10/29/21 0447  NA 130* 132*  K 4.6 4.3  CL 89* 93*  CO2 31 34*  GLUCOSE 96 119*  BUN 44* 43*  CREATININE 0.93 0.79  CALCIUM 8.3* 8.3*  MG 2.2 2.0   Liver Function Tests: No results for input(s): "AST", "ALT", "ALKPHOS", "BILITOT", "PROT", "ALBUMIN" in the last 72 hours. No results for input(s): "LIPASE", "AMYLASE" in the last 72 hours. CBC: Recent Labs    10/28/21 0537 10/29/21 0447  WBC 17.1* 13.4*  HGB 11.5* 11.3*  HCT 34.9* 34.7*  MCV 92.3 93.3  PLT 305 257   Cardiac Enzymes: No results for input(s): "CKTOTAL", "CKMB", "CKMBINDEX", "TROPONINI" in the last 72 hours. BNP: Invalid input(s): "POCBNP" D-Dimer: No results for input(s): "DDIMER" in the last 72 hours. Hemoglobin A1C: No results for input(s): "HGBA1C" in the last 72 hours. Fasting Lipid Panel: No results for input(s): "CHOL", "HDL", "LDLCALC",  "TRIG", "CHOLHDL", "LDLDIRECT" in the last 72 hours. Thyroid Function Tests: No results for input(s): "TSH", "T4TOTAL", "T3FREE", "THYROIDAB" in the last 72 hours.  Invalid input(s): "FREET3" Anemia Panel: No results for input(s): "VITAMINB12", "FOLATE", "FERRITIN", "TIBC", "IRON", "RETICCTPCT" in the last 72 hours.  DG Chest Port 1 View  Result Date: 10/28/2021 CLINICAL DATA:  Dyspnea EXAM: PORTABLE CHEST 1 VIEW COMPARISON:  Chest radiograph 2 days prior FINDINGS: The heart is enlarged, unchanged. The upper mediastinal contours are stable. There are increased interstitial markings throughout both lungs likely reflecting worsened pulmonary interstitial edema. There are left larger than right pleural effusions, grossly similar though with worsened aeration of the right base. There is no pneumothorax. The left apical mass is again seen common better seen on prior chest CT. The bones are stable. IMPRESSION: Worsened pulmonary interstitial edema with left larger than right pleural effusions and worsened aeration of the right base since 10/26/2021. Electronically Signed   By: Valetta Mole M.D.   On: 10/28/2021 12:17   US RENAL  Result Date: 10/27/2021 CLINICAL DATA:  Acute kidney injury EXAM: RENAL / URINARY TRACT ULTRASOUND COMPLETE COMPARISON:  PET-CT 05/19/2021 FINDINGS: Right Kidney: Renal measurements: 10.6 x 4.5 x 4.4 cm = volume: 110 mL. Echogenicity within normal limits. No hydronephrosis. There is a 1.0 x 0.6 x 0.8  cm simple cyst in the mid kidney. Left Kidney: Renal measurements: 10.3 x 4.5 x 4.6 cm = volume: 111 mL. Echogenicity within normal limits. There is no hydronephrosis. There is a 4.6 x 2.7 x 2.4 cm cyst in the lower pole. There is a 1.3 x 1.0 x 1.2 cm cyst in the superior pole. Bladder: Appears normal for degree of bladder distention. Other: None. IMPRESSION: No hydronephrosis. Bilateral renal cysts. Electronically Signed   By: Ronney Asters M.D.   On: 10/27/2021 16:27     Echo EF  50-55%, moderate left pleural effusion, moderate to severe mitral regurgitation, moderate to severe aortic stenosis, mild to moderate tricuspid regurgitation  TELEMETRY: Sinus tachycardia 3 bpm:  ASSESSMENT AND PLAN:  Principal Problem:   Acute on chronic respiratory failure with hypoxia and hypercapnia (HCC) Active Problems:   CAP (community acquired pneumonia)   Bilateral pleural effusion   Lung mass   COPD with acute exacerbation (HCC)   History of primary non-small cell carcinoma of left lung   SIRS (systemic inflammatory response syndrome) (HCC)   Aortic stenosis   Acute on chronic diastolic CHF (congestive heart failure) (HCC)   Hypoglycemia   AKI (acute kidney injury) ([place])   Hyperkalemia   Hyponatremia   Hospice care patient    1.  Acute on chronic diastolic congestive heart failure, with elevated BNP, improved after IV furosemide 40 x1 , 20 mg x 3 (on home Lasix 20 mg every other day) 2.  Moderate aortic stenosis and aortic insufficiency 3.  Acute on chronic respiratory failure, with history of underlying COPD, on chronic O2, improved after nebulizer treatment and IV Solu-Medrol 4.  Community-acquired pneumonia 5.  Bilateral pleural effusion 6.  History of primary non-small cell carcinoma left lung with stable spiculated mass left apex 7.  Dementia   Recommendations   1.  Agree with current therapy 2.  Resume home furosemide dose 3.  Probable discharge home later today 4.  Follow-up with Dr. Nehemiah Massed in 1 to 2 weeks   Isaias Cowman, MD, PhD, [facility] 10/29/2021 2:32 PM

## 2021-10-29 NOTE — Discharge Summary (Signed)
Physician Discharge Summary   Patient: Krystal Greene MRN: [id number] DOB: 1936-01-04  Admit date:     10/26/2021  Discharge date: 10/30/21  Discharge Physician: Ezekiel Slocumb   PCP: Idelle Crouch, MD   Recommendations at discharge:   Follow up with [facility] hospice team Follow up with primary care Monitor for adequate symptom control, primarily dyspnea   Discharge Diagnoses: Principal Problem:   Acute on chronic respiratory failure with hypoxia and hypercapnia (HCC) Active Problems:   COPD with acute exacerbation (HCC)   CAP (community acquired pneumonia)   Acute on chronic diastolic CHF (congestive heart failure) (HCC)   SIRS (systemic inflammatory response syndrome) (HCC)   Bilateral pleural effusion   Lung mass   History of primary non-small cell carcinoma of left lung   Aortic stenosis   Hypoglycemia   AKI (acute kidney injury) ([place])   Hyperkalemia   Hyponatremia   Hospice care patient  Resolved Problems:   * No resolved hospital problems. *  Hospital Course: 86 years old female with PMH significant for left upper lobe lung cancer(s/p radiation therapy in 2015) with recurrent stable left upper lobe mass, COPD on home oxygen at 3 L, regularly follows up with pulmonology, symptomatic moderate aortic stenosis and aortic insufficiency regularly follows with cardiology, dementia, hypertension, tobacco abuse presented in the ED with 3-week history of shortness of breath on exertion and wheezing.  Patient was significantly hypoxic on arrival with increased work of breathing.  SPO2 80% on her home oxygen level.  She was given DuoNeb, Solu-Medrol and magnesium in route.  She was tachypneic, tachycardic with SPO2 of 99% after she is being placed on BiPAP.  CTA chest shows bilateral pleural effusion with bibasilar consolidation.  Stable spiculated mass lesion in the left apex, no evidence of pulmonary embolism.  Patient is admitted for acute on chronic hypoxic and  hypercapnic respiratory failure requiring BiPAP.  She is started on IV antibiotics and diuresis.  Cardiology consulted.   Patient underwent right-sided thoracentesis on 10/3, 1 L pleural fluid removed. 10/4: Creatinine increased from 0.64 up to 1.20, diuresis held off throughout worsening. 10/5-6: Pt and family met with palliative care, elected to go home with hospice.  Patient's breathing improved.  Not having the episodes of dyspnea on exertion today. She is stable for discharge with family and hospice to follow.      Assessment and Plan: * Acute on chronic respiratory failure with hypoxia and hypercapnia (HCC) Multifactorial, mostly to COPD exacerbation but also possible CAP, bilateral pleural effusions and possible CHF though no history of same At baseline wears 3 L and transiently required BiPAP in the ED for the BG showing pH 7.24 with PCO2 78 Treat acute issues as outlined Pulmonology consulted  10/4: On 10 L/min HFNC O2 10/5: On 6-8 L/min HFNC, ongoing episodes of distress, recurrent right pleural effusion. 10/6: on basleine 4 L/min O2  COPD with acute exacerbation (HCC) Schedule and as needed nebulized bronchodilator treatments, IV steroids Antitussives, flutter valve, incentive spirometer  Acute on chronic diastolic CHF (congestive heart failure) (HCC) Moderate aortic stenosis and aortic insufficiency Treated with IV Lasix Echocardiogram stable from prior Daily weights with intake and output monitoring Cardiology consulted, follow up recommendations  CAP (community acquired pneumonia) Chest CT showing possible basilar consolidation Rocephin and azithromycin Other treatment as outlined above  SIRS (systemic inflammatory response syndrome) ([place]) Possible sepsis Patient with tachycardia, tachypnea, leukocytosis and lactic acidosis Suspect SIRS.  Suspect lactic acidosis secondary to respiratory distress Will hold  off on sepsis fluids especially with diagnosis of  possible CHF We will continue to monitor closely and initiate sepsis fluids if more convincing for actual sepsis  Bilateral pleural effusion Possibly related to pneumonia, lung mass, CHF Patient underwent right-sided thoracentesis on 10/3 with 1 L pleural fluid removed. -- Follow-up pleural fluid cultures and cytology -- Monitor respiratory status -- O2 per protocol -- Pulmonology follow-up outpatient  Hyponatremia Initial Na was 135 on admission >> 130 >> 132. Likely hypervolemic in setting of decompensated CHF. May have a component of SIADH given her lung mass as well. --Diuresis as renal function and BP allows --Monitor BMP --  Hyperkalemia K5.6 this morning, also with new AKI. Initial potassium was 4.6. Has been getting IV Lasix -- Monitor BMP -- AKI eval as outlined -- Lokelma 5 g x 1  AKI (acute kidney injury) ([place]) Creatinine on admission was normal 0.64, this morning elevated 1.20 after being kept n.p.o. for thoracentesis yesterday.  Also getting diuresis. -- Resume diet -- Held off Lasix 10/4 PM due to Cr rising --Cr improved, resume Lasix 40 IV daily given ongoing respiratory issues and recurrent pleural effusion -- Monitor BMP daily --Renal ultrasound negative for obstruction. --Bladder scans to ensure emptying  Hypoglycemia Glucose on a.m. BMP was 54 this morning (10/4).  Due to poor p.o. intake while n.p.o. for procedure yesterday. --Resume diet.   --Hypoglycemia protocol.  Aortic stenosis Noted.  No acute issues.  History of primary non-small cell carcinoma of left lung Stable spiculated mass left apex Status post chemo about 8 years prior Patient is followed by pulmonology  Lung mass Chronic, noted to be stable on CTA chest on admission 10/3.  Follow-up with pulmonology. Recently underwent PET scan on 05/19/2021 which showed the left apical lesion to be enlarging with low-level metabolic activity possibly secondary to radiation fibrosis although  progression cannot be excluded.  CT chest repeat recommended in 6 months.         Consultants: Cardiology ,Palliative Care Procedures performed: None  Disposition:  Home with hospice Diet recommendation:  Discharge Diet Orders (From admission, onward)     Start     Ordered   10/29/21 0000  Diet - low sodium heart healthy        10/29/21 1145           Cardiac diet DISCHARGE MEDICATION: Allergies as of 10/29/2021       Reactions   Hydrocodone Other (See Comments)   Syncope         Medication List     STOP taking these medications    alendronate 70 MG tablet Commonly known as: FOSAMAX   bisoprolol-hydrochlorothiazide 5-6.25 MG tablet Commonly known as: ZIAC   cefUROXime 250 MG tablet Commonly known as: CEFTIN   spironolactone 50 MG tablet Commonly known as: ALDACTONE       TAKE these medications    acetaminophen 325 MG tablet Commonly known as: TYLENOL Take 2 tablets (650 mg total) by mouth every 6 (six) hours as needed for mild pain (or Fever >/= 101).   Advair Diskus 250-50 MCG/ACT Aepb Generic drug: fluticasone-salmeterol Inhale 1 puff into the lungs 2 (two) times daily.   albuterol 108 (90 Base) MCG/ACT inhaler Commonly known as: VENTOLIN HFA Inhale 2 puffs into the lungs every 6 (six) hours as needed for wheezing or shortness of breath.   amoxicillin-clavulanate 875-125 MG tablet Commonly known as: AUGMENTIN Take 1 tablet by mouth 2 (two) times daily for 3 days.   arformoterol  15 MCG/2ML Nebu Commonly known as: BROVANA Take 2 mLs by nebulization every 12 (twelve) hours.   feeding supplement Liqd Take 237 mLs by mouth 2 (two) times daily between meals.   furosemide 20 MG tablet Commonly known as: LASIX Take 20 mg by mouth daily.   ipratropium-albuterol 0.5-2.5 (3) MG/3ML Soln Commonly known as: DUONEB Take 3 mLs by nebulization 2 (two) times daily.   lidocaine 5 % Commonly known as: LIDODERM Place 1 patch onto the skin daily.  Remove & Discard patch within 12 hours or as directed by MD   modafinil 100 MG tablet Commonly known as: PROVIGIL Take 100 mg by mouth daily.   morphine 20 MG/ML concentrated solution Commonly known as: ROXANOL Take 0.25-0.5 mLs (5-10 mg total) by mouth every 2 (two) hours as needed for severe pain, shortness of breath or anxiety.   nicotine 21 mg/24hr patch Commonly known as: NICODERM CQ - dosed in mg/24 hours Place 1 patch (21 mg total) onto the skin daily.   predniSONE 10 MG tablet Commonly known as: DELTASONE Take 3 tablets (30 mg total) by mouth daily with breakfast for 1 day, THEN 2 tablets (20 mg total) daily with breakfast for 1 day, THEN 1 tablet (10 mg total) daily with breakfast for 1 day. Start taking on: October 29, 2021        Follow-up Information     Corey Skains, MD Follow up in 1 week(s).   Specialty: Cardiology Contact information: [address] [facility] [place] [address] [phone]                Discharge Exam: Danley Danker Weights   10/26/21 0038 10/29/21 0540  Weight: 45 kg 43.2 kg   General exam: awake, alert, no acute distress, cachectic, frail HEENT: moist mucus membranes, hearing grossly normal  Respiratory system: CTAB, no wheezes, rales or rhonchi, normal respiratory effort. Cardiovascular system: normal N2/T5, RRR, systolic murmur, no pedal edema.   Central nervous system: A&O xself and hospital. no gross focal neurologic deficits, normal speech Extremities: moves all, no edema, normal tone Skin: dry, intact, normal temperature\ Psychiatry: normal mood, congruent affect, judgement and insight appear normal   Condition at discharge: stable  The results of significant diagnostics from this hospitalization (including imaging, microbiology, ancillary and laboratory) are listed below for reference.   Imaging Studies: ECHOCARDIOGRAM COMPLETE  Result Date: 10/28/2021    ECHOCARDIOGRAM REPORT    Patient Name:   ASJAH RAUDA Mars Date of Exam: 10/26/2021 Medical Rec #:  [id number]         Height:       62.0 in Accession #:    [id number]        Weight:       99.2 lb Date of Birth:  [birth year]          BSA:          1.419 m Patient Age:    7 years          BP:           117/63 mmHg Patient Gender: F                 HR:           105 bpm. Exam Location:  [facility] Procedure: 2D Echo, Cardiac Doppler and Color Doppler Indications:     CHF- acute diastolic B15.17  History:         Patient has no prior history of Echocardiogram examinations.  GERD, lung cancer.  Sonographer:     Sherrie Sport Referring Phys:  [id number] Athena Masse Diagnosing Phys: Isaias Cowman MD IMPRESSIONS  1. Left ventricular ejection fraction, by estimation, is 50 to 55%. The left ventricle has low normal function. The left ventricle has no regional wall motion abnormalities. Left ventricular diastolic parameters are consistent with Grade I diastolic dysfunction (impaired relaxation).  2. Right ventricular systolic function is normal. The right ventricular size is normal.  3. Moderate pleural effusion in the left lateral region.  4. The mitral valve is normal in structure. Moderate to severe mitral valve regurgitation. No evidence of mitral stenosis.  5. Tricuspid valve regurgitation is mild to moderate.  6. The aortic valve is normal in structure. Aortic valve regurgitation is not visualized. Moderate to severe aortic valve stenosis.  7. The inferior vena cava is normal in size with greater than 50% respiratory variability, suggesting right atrial pressure of 3 mmHg. FINDINGS  Left Ventricle: Left ventricular ejection fraction, by estimation, is 50 to 55%. The left ventricle has low normal function. The left ventricle has no regional wall motion abnormalities. The left ventricular internal cavity size was normal in size. There is no left ventricular hypertrophy. Left ventricular diastolic parameters are consistent with Grade I  diastolic dysfunction (impaired relaxation). Right Ventricle: The right ventricular size is normal. No increase in right ventricular wall thickness. Right ventricular systolic function is normal. Left Atrium: Left atrial size was normal in size. Right Atrium: Right atrial size was normal in size. Pericardium: There is no evidence of pericardial effusion. Mitral Valve: The mitral valve is normal in structure. Moderate to severe mitral valve regurgitation. No evidence of mitral valve stenosis. Tricuspid Valve: The tricuspid valve is normal in structure. Tricuspid valve regurgitation is mild to moderate. No evidence of tricuspid stenosis. Aortic Valve: The aortic valve is normal in structure. Aortic valve regurgitation is not visualized. Moderate to severe aortic stenosis is present. Aortic valve mean gradient measures 31.8 mmHg. Aortic valve peak gradient measures 47.6 mmHg. Aortic valve  area, by VTI measures 0.37 cm. Pulmonic Valve: The pulmonic valve was normal in structure. Pulmonic valve regurgitation is not visualized. No evidence of pulmonic stenosis. Aorta: The aortic root is normal in size and structure. Venous: The inferior vena cava is normal in size with greater than 50% respiratory variability, suggesting right atrial pressure of 3 mmHg. IAS/Shunts: No atrial level shunt detected by color flow Doppler. Additional Comments: There is a moderate pleural effusion in the left lateral region.  LEFT VENTRICLE PLAX 2D LVIDd:         4.63 cm   Diastology LVIDs:         3.40 cm   LV e' medial:    4.24 cm/s LV PW:         1.27 cm   LV E/e' medial:  30.4 LV IVS:        1.07 cm   LV e' lateral:   4.68 cm/s LVOT diam:     1.90 cm   LV E/e' lateral: 27.6 LV SV:         26 LV SV Index:   18 LVOT Area:     2.84 cm  RIGHT VENTRICLE RV Basal diam:  3.70 cm RV Mid diam:    2.50 cm LEFT ATRIUM             Index        RIGHT ATRIUM          Index  LA diam:        3.60 cm 2.54 cm/m   RA Area:     8.42 cm LA Vol (A2C):   57.9  ml 40.80 ml/m  RA Volume:   14.30 ml 10.08 ml/m LA Vol (A4C):   51.8 ml 36.50 ml/m LA Biplane Vol: 59.0 ml 41.58 ml/m  AORTIC VALVE AV Area (Vmax):    0.41 cm AV Area (Vmean):   0.37 cm AV Area (VTI):     0.37 cm AV Vmax:           345.00 cm/s AV Vmean:          267.200 cm/s AV VTI:            0.704 m AV Peak Grad:      47.6 mmHg AV Mean Grad:      31.8 mmHg LVOT Vmax:         49.50 cm/s LVOT Vmean:        34.800 cm/s LVOT VTI:          0.092 m LVOT/AV VTI ratio: 0.13  AORTA Ao Root diam: 2.40 cm MITRAL VALVE MV Area (PHT): 6.27 cm     SHUNTS MV Decel Time: 121 msec     Systemic VTI:  0.09 m MV E velocity: 129.00 cm/s  Systemic Diam: 1.90 cm MV A velocity: 173.00 cm/s MV E/A ratio:  0.75 Isaias Cowman MD Electronically signed by Isaias Cowman MD Signature Date/Time: 10/28/2021/4:56:11 PM    Final    DG Chest Port 1 View  Result Date: 10/28/2021 CLINICAL DATA:  Dyspnea EXAM: PORTABLE CHEST 1 VIEW COMPARISON:  Chest radiograph 2 days prior FINDINGS: The heart is enlarged, unchanged. The upper mediastinal contours are stable. There are increased interstitial markings throughout both lungs likely reflecting worsened pulmonary interstitial edema. There are left larger than right pleural effusions, grossly similar though with worsened aeration of the right base. There is no pneumothorax. The left apical mass is again seen common better seen on prior chest CT. The bones are stable. IMPRESSION: Worsened pulmonary interstitial edema with left larger than right pleural effusions and worsened aeration of the right base since 10/26/2021. Electronically Signed   By: Valetta Mole M.D.   On: 10/28/2021 12:17   US RENAL  Result Date: 10/27/2021 CLINICAL DATA:  Acute kidney injury EXAM: RENAL / URINARY TRACT ULTRASOUND COMPLETE COMPARISON:  PET-CT 05/19/2021 FINDINGS: Right Kidney: Renal measurements: 10.6 x 4.5 x 4.4 cm = volume: 110 mL. Echogenicity within normal limits. No hydronephrosis. There is a 1.0  x 0.6 x 0.8 cm simple cyst in the mid kidney. Left Kidney: Renal measurements: 10.3 x 4.5 x 4.6 cm = volume: 111 mL. Echogenicity within normal limits. There is no hydronephrosis. There is a 4.6 x 2.7 x 2.4 cm cyst in the lower pole. There is a 1.3 x 1.0 x 1.2 cm cyst in the superior pole. Bladder: Appears normal for degree of bladder distention. Other: None. IMPRESSION: No hydronephrosis. Bilateral renal cysts. Electronically Signed   By: Ronney Asters M.D.   On: 10/27/2021 16:27   DG Chest Port 1 View  Result Date: 10/26/2021 CLINICAL DATA:  Ultrasound guided right thoracentesis EXAM: PORTABLE CHEST 1 VIEW COMPARISON:  10/26/2021 FINDINGS: Single frontal view of the chest demonstrates a stable cardiac silhouette. Extensive calcification of the mitral annulus. Decreased right pleural effusion after interval thoracentesis. No evidence of pneumothorax. Stable spiculated left apical mass. Persistent left basilar consolidation and left pleural effusion. No acute bony abnormalities.  IMPRESSION: 1. No complication after right thoracentesis, with complete resolution of the right pleural effusion seen previously. 2. Persistent left basilar consolidation and effusion. 3. Stable spiculated left apical mass. Electronically Signed   By: Randa Ngo M.D.   On: 10/26/2021 16:03   US THORACENTESIS ASP PLEURAL SPACE W/IMG GUIDE  Result Date: 10/26/2021 INDICATION: Pleural effusion, shortness of breath EXAM: ULTRASOUND GUIDED RIGHT THORACENTESIS MEDICATIONS: None. COMPLICATIONS: None immediate. PROCEDURE: An ultrasound guided thoracentesis was thoroughly discussed with the patient and questions answered. The benefits, risks, alternatives and complications were also discussed. The patient understands and wishes to proceed with the procedure. Written consent was obtained. Ultrasound was performed to localize and mark an adequate pocket of fluid in the right chest. The area was then prepped and draped in the normal sterile  fashion. 1% Lidocaine was used for local anesthesia. Under ultrasound guidance a 6 Fr Safe-T-Centesis catheter was introduced. Thoracentesis was performed. The catheter was removed and a dressing applied. FINDINGS: A total of approximately 1L of thin red pleural fluid was removed. Samples were sent to the laboratory as requested by the clinical team. IMPRESSION: Successful ultrasound guided right thoracentesis yielding 1L of pleural fluid. Performed and dictated by Pasty Spillers, PA-C Electronically Signed   By: Albin Felling M.D.   On: 10/26/2021 15:49   CT Angio Chest PE W and/or Wo Contrast  Result Date: 10/26/2021 CLINICAL DATA:  Shortness of breath for 2 days, initial encounter EXAM: CT ANGIOGRAPHY CHEST WITH CONTRAST TECHNIQUE: Multidetector CT imaging of the chest was performed using the standard protocol during bolus administration of intravenous contrast. Multiplanar CT image reconstructions and MIPs were obtained to evaluate the vascular anatomy. RADIATION DOSE REDUCTION: This exam was performed according to the departmental dose-optimization program which includes automated exposure control, adjustment of the mA and/or kV according to patient size and/or use of iterative reconstruction technique. CONTRAST:  29m OMNIPAQUE IOHEXOL 350 MG/ML SOLN COMPARISON:  Chest x-ray from earlier in the same day. FINDINGS: Cardiovascular: Thoracic aorta and its branches demonstrate atherosclerotic calcifications. No aneurysmal dilatation is seen. The pulmonary artery shows a normal branching pattern bilaterally. No filling defect to suggest pulmonary embolism is seen. No cardiac enlargement is noted. Coronary calcifications are seen. Mediastinum/Nodes: Thoracic inlet is within normal limits. No hilar or mediastinal adenopathy is noted. The esophagus is within normal limits. Lungs/Pleura: Lungs demonstrate evidence of a large area of spiculated architectural distortion in the left apex stable from the prior exam.  Bilateral pleural effusions are noted right considerably greater than left with associated lower lobe consolidation. Mild middle lobe atelectatic changes are noted as well. Upper Abdomen: No acute abnormality. Musculoskeletal: Degenerative changes of the thoracic spine are noted. No acute rib fracture is noted. Stable sclerosis in the posterior aspect of the right third rib is seen. Chronic compression deformity is noted at T9 stable in appearance from the prior study. Review of the MIP images confirms the above findings. IMPRESSION: Bilateral pleural effusions with basilar consolidation as described. Stable spiculated mass lesion in the left apex. No evidence of pulmonary emboli. Electronically Signed   By: MInez CatalinaM.D.   On: 10/26/2021 03:07   DG Chest Portable 1 View  Result Date: 10/26/2021 CLINICAL DATA:  Shortness of breath. EXAM: PORTABLE CHEST 1 VIEW COMPARISON:  February 04, 2021 FINDINGS: The cardiac silhouette is mildly enlarged. There is a stable left apical mass. Stable, diffuse, chronic appearing increased interstitial lung markings are also noted. Moderate severity right basilar atelectasis and/or infiltrate is seen  with mild left basilar atelectasis. There are small bilateral pleural effusions. No pneumothorax is identified. The visualized skeletal structures are unremarkable. IMPRESSION: 1. Stable left apical mass. 2. Moderate severity right basilar atelectasis and/or infiltrate with mild left basilar atelectasis. 3. Small bilateral pleural effusions. Electronically Signed   By: Virgina Norfolk M.D.   On: 10/26/2021 01:03    Microbiology: Results for orders placed or performed during the hospital encounter of 10/26/21  SARS Coronavirus 2 by RT PCR (hospital order, performed in [facility] hospital lab) *cepheid single result test* Anterior Nasal Swab     Status: None   Collection Time: 10/26/21 12:48 AM   Specimen: Anterior Nasal Swab  Result Value Ref Range Status   SARS  Coronavirus 2 by RT PCR NEGATIVE NEGATIVE Final    Comment: (NOTE) SARS-CoV-2 target nucleic acids are NOT DETECTED.  The SARS-CoV-2 RNA is generally detectable in upper and lower respiratory specimens during the acute phase of infection. The lowest concentration of SARS-CoV-2 viral copies this assay can detect is 250 copies / mL. A negative result does not preclude SARS-CoV-2 infection and should not be used as the sole basis for treatment or other patient management decisions.  A negative result may occur with improper specimen collection / handling, submission of specimen other than nasopharyngeal swab, presence of viral mutation(s) within the areas targeted by this assay, and inadequate number of viral copies (<250 copies / mL). A negative result must be combined with clinical observations, patient history, and epidemiological information.  Fact Sheet for Patients:   https://www.patel.info/  Fact Sheet for Healthcare Providers: https://hall.com/  This test is not yet approved or  cleared by the [place] FDA and has been authorized for detection and/or diagnosis of SARS-CoV-2 by FDA under an Emergency Use Authorization (EUA).  This EUA will remain in effect (meaning this test can be used) for the duration of the COVID-19 declaration under Section 564(b)(1) of the Act, 21 U.S.C. section 360bbb-3(b)(1), unless the authorization is terminated or revoked sooner.  Performed at [facility], [address] [place], [place] [address]   Blood culture (routine x 2)     Status: None (Preliminary result)   Collection Time: 10/26/21  1:59 AM   Specimen: BLOOD  Result Value Ref Range Status   Specimen Description BLOOD LEFT FA  Final   Special Requests   Final    BOTTLES DRAWN AEROBIC AND ANAEROBIC Blood Culture results may not be optimal due to an excessive volume of blood received in culture bottles   Culture   Final    NO  GROWTH 4 DAYS Performed at [facility], [address], [place], [place] [address]    Report Status PENDING  Incomplete  Blood culture (routine x 2)     Status: None (Preliminary result)   Collection Time: 10/26/21  1:59 AM   Specimen: BLOOD  Result Value Ref Range Status   Specimen Description BLOOD LEFT AV  Final   Special Requests   Final    BOTTLES DRAWN AEROBIC AND ANAEROBIC Blood Culture adequate volume   Culture   Final    NO GROWTH 4 DAYS Performed at [facility], [address], [place], [place] [address]    Report Status PENDING  Incomplete  Respiratory (~20 pathogens) panel by PCR     Status: None   Collection Time: 10/26/21  5:08 AM   Specimen: Nasopharyngeal Swab; Respiratory  Result Value Ref Range Status   Adenovirus NOT DETECTED NOT DETECTED  Final   Coronavirus 229E NOT DETECTED NOT DETECTED Final    Comment: (NOTE) The Coronavirus on the Respiratory Panel, DOES NOT test for the novel  Coronavirus (2019 nCoV)    Coronavirus HKU1 NOT DETECTED NOT DETECTED Final   Coronavirus NL63 NOT DETECTED NOT DETECTED Final   Coronavirus OC43 NOT DETECTED NOT DETECTED Final   Metapneumovirus NOT DETECTED NOT DETECTED Final   Rhinovirus / Enterovirus NOT DETECTED NOT DETECTED Final   Influenza A NOT DETECTED NOT DETECTED Final   Influenza B NOT DETECTED NOT DETECTED Final   Parainfluenza Virus 1 NOT DETECTED NOT DETECTED Final   Parainfluenza Virus 2 NOT DETECTED NOT DETECTED Final   Parainfluenza Virus 3 NOT DETECTED NOT DETECTED Final   Parainfluenza Virus 4 NOT DETECTED NOT DETECTED Final   Respiratory Syncytial Virus NOT DETECTED NOT DETECTED Final   Bordetella pertussis NOT DETECTED NOT DETECTED Final   Bordetella Parapertussis NOT DETECTED NOT DETECTED Final   Chlamydophila pneumoniae NOT DETECTED NOT DETECTED Final   Mycoplasma pneumoniae NOT DETECTED NOT DETECTED Final    Comment: Performed at [facility], [place] [address],  [place], [place] [address]  Body fluid culture w Gram Stain     Status: None   Collection Time: 10/26/21  3:00 PM   Specimen: PATH Cytology Pleural fluid  Result Value Ref Range Status   Specimen Description   Final    PLEURAL Performed at [facility], [address] [place], [place] [address]    Special Requests   Final    NONE Performed at [facility], [address] [place], [place] [address]    Gram Stain NO WBC SEEN NO ORGANISMS SEEN   Final   Culture   Final    NO GROWTH 3 DAYS Performed at [facility], [place] [address] [place], [place] [address]    Report Status 10/30/2021 FINAL  Final    Labs: CBC: Recent Labs  Lab 10/26/21 0043 10/26/21 0508 10/28/21 0537 10/29/21 0447  WBC 13.4* 15.0* 17.1* 13.4*  NEUTROABS 10.1*  --   --   --   HGB 12.8 12.0 11.5* 11.3*  HCT 39.7 37.8 34.9* 34.7*  MCV 94.3 94.3 92.3 93.3  PLT 341 288 305 838   Basic Metabolic Panel: Recent Labs  Lab 10/26/21 0043 10/26/21 0508 10/27/21 1226 10/28/21 0537 10/29/21 0447  NA 131*  --  135 130* 132*  K 4.6  --  5.6* 4.6 4.3  CL 92*  --  92* 89* 93*  CO2 31  --  34* 31 34*  GLUCOSE 215*  --  54* 96 119*  BUN 25*  --  37* 44* 43*  CREATININE 0.64 0.73 1.20* 0.93 0.79  CALCIUM 8.8*  --  8.7* 8.3* 8.3*  MG  --   --   --  2.2 2.0   Liver Function Tests: Recent Labs  Lab 10/26/21 0043  AST 28  ALT 18  ALKPHOS 69  BILITOT 0.5  PROT 7.2  ALBUMIN 3.9   CBG: Recent Labs  Lab 10/28/21 1101 10/28/21 1651 10/28/21 2117 10/29/21 0809 10/29/21 1117  GLUCAP 138* 350* 179* 140* 256*    Discharge time spent: greater than 30 minutes.  Signed: Ezekiel Slocumb, DO [facility] 10/30/2021

## 2021-10-30 LAB — BODY FLUID CULTURE W GRAM STAIN
Culture: NO GROWTH
Gram Stain: NONE SEEN

## 2021-10-31 LAB — CULTURE, BLOOD (ROUTINE X 2)
Culture: NO GROWTH
Culture: NO GROWTH
Special Requests: ADEQUATE

## 2021-11-09 LAB — BLOOD GAS, VENOUS
Acid-Base Excess: 3.4 mmol/L — ABNORMAL HIGH (ref 0.0–2.0)
Bicarbonate: 33.4 mmol/L — ABNORMAL HIGH (ref 20.0–28.0)
O2 Saturation: 96.5 %
Patient temperature: 37
pCO2, Ven: 78 mmHg (ref 44–60)
pH, Ven: 7.24 — ABNORMAL LOW (ref 7.25–7.43)
pO2, Ven: 74 mmHg — ABNORMAL HIGH (ref 32–45)

## 2021-11-24 DEATH — deceased

## 2021-12-01 ENCOUNTER — Inpatient Hospital Stay: Admission: RE | Admit: 2021-12-01 | Payer: Medicare Other | Source: Ambulatory Visit
